# Patient Record
Sex: Female | Born: 1940 | Race: White | Hispanic: No | Marital: Married
Health system: Midwestern US, Community
[De-identification: ages and names within clinical notes are randomized; demographics above are authoritative.]

## PROBLEM LIST (undated history)

## (undated) DIAGNOSIS — E034 Atrophy of thyroid (acquired): Secondary | ICD-10-CM

## (undated) DIAGNOSIS — E7800 Pure hypercholesterolemia, unspecified: Secondary | ICD-10-CM

## (undated) DIAGNOSIS — Z1231 Encounter for screening mammogram for malignant neoplasm of breast: Secondary | ICD-10-CM

## (undated) DIAGNOSIS — Z8249 Family history of ischemic heart disease and other diseases of the circulatory system: Secondary | ICD-10-CM

## (undated) DIAGNOSIS — E039 Hypothyroidism, unspecified: Secondary | ICD-10-CM

## (undated) DIAGNOSIS — Z1239 Encounter for other screening for malignant neoplasm of breast: Secondary | ICD-10-CM

## (undated) DIAGNOSIS — Z136 Encounter for screening for cardiovascular disorders: Secondary | ICD-10-CM

## (undated) DIAGNOSIS — R928 Other abnormal and inconclusive findings on diagnostic imaging of breast: Secondary | ICD-10-CM

## (undated) DIAGNOSIS — K5730 Diverticulosis of large intestine without perforation or abscess without bleeding: Secondary | ICD-10-CM

## (undated) MED ORDER — ATORVASTATIN 10 MG TAB
10 mg | ORAL_TABLET | Freq: Every day | ORAL | Status: DC
Start: ? — End: 2014-12-24

## (undated) MED ORDER — LEVOTHYROXINE 100 MCG TAB
100 mcg | ORAL_TABLET | Freq: Every day | ORAL | Status: DC
Start: ? — End: 2013-10-12

## (undated) MED ORDER — LEVOTHYROXINE 100 MCG TAB
100 mcg | ORAL_TABLET | Freq: Every day | ORAL | Status: DC
Start: ? — End: 2014-09-27

## (undated) MED ORDER — ATORVASTATIN 10 MG TAB
10 mg | ORAL_TABLET | Freq: Every day | ORAL | Status: DC
Start: ? — End: 2013-10-12

---

## 2012-08-24 ENCOUNTER — Encounter

## 2013-05-04 NOTE — Progress Notes (Signed)
HISTORY OF PRESENT ILLNESS  Jaime Bailey is a 72 y.o. female.  HPI  She is a new patient and is here to establish care.   Recently moved to town.  The following sections were reviewed & updated as appropriate: PMH, PL, PSH, and SH.  Husband is retired Web designer.    Cardiovascular Review  The patient has hypertension.  She reports taking medications as instructed, no medication side effects noted.  Diet and Lifestyle: generally follows a low fat low cholesterol diet, exercises regularly, walks daily.  Lab review: no lab studies available for review at time of visit.  She reports being on brand name medication until 6 months ago.    Endocrine Review  Patient is seen for followup of hypothyroidism.  She reports medication compliance: compliant all of the time.  She reports the following concerns/problems: none.  Lab review: no lab studies available for review at time of visit.           Prior to Admission medications    Medication Sig Start Date End Date Taking? Authorizing Provider   atorvastatin (LIPITOR) 10 mg tablet Take  by mouth daily.   Yes Historical Provider   levothyroxine (SYNTHROID) 100 mcg tablet Take  by mouth Daily (before breakfast).   Yes Historical Provider   aspirin delayed-release 81 mg tablet Take  by mouth daily.   Yes Historical Provider        No Known Allergies       Patient Active Problem List    Diagnosis Date Noted   ??? Hypothyroid    ??? Hyperlipidemia        Past Surgical History   Procedure Laterality Date   ??? Hx tah and bso  1999   ??? Hx colonoscopy  1998     Colonic polyp removal at time of colonoscopy       History   Substance Use Topics   ??? Smoking status: Former Smoker -- 1.00 packs/day for 10 years     Quit date: 05/04/1977   ??? Smokeless tobacco: Not on file   ??? Alcohol Use: 0.5 oz/week     1 Glasses of wine per week           Review of Systems   Constitutional: Negative for weight loss and malaise/fatigue.   Respiratory: Negative for cough and shortness of breath.    Cardiovascular:  Negative for chest pain and palpitations.   Gastrointestinal: Positive for diarrhea and constipation. Negative for nausea, vomiting and abdominal pain.       Physical Exam   Constitutional: She appears well-developed. No distress.   HENT:   Mouth/Throat: Mucous membranes are normal.   Eyes: Conjunctivae and lids are normal. No scleral icterus.   Neck: Neck supple. No thyromegaly present.   Cardiovascular: Regular rhythm and normal heart sounds.    No murmur heard.  Pulmonary/Chest: Effort normal and breath sounds normal. She has no wheezes. She has no rales.   Abdominal: Soft. Bowel sounds are normal. She exhibits no mass. There is no hepatosplenomegaly. There is no tenderness.   Musculoskeletal: She exhibits no edema.   Lymphadenopathy:     She has no cervical adenopathy.   Skin: No rash noted.   Psychiatric: She has a normal mood and affect. Her behavior is normal.       BP 122/82   Pulse 60   Temp(Src) 98 ??F (36.7 ??C) (Oral)   Resp 14   Ht 5\' 6"  (1.676 m)   Wt 190  lb 1.6 oz (86.229 kg)   BMI 30.7 kg/m2   SpO2 98%      ASSESSMENT and PLAN  Jaime Bailey was seen today for establish care and labs.  Diagnoses and associated orders for this visit:    Hyperlipidemia- new dx to me, chronic issue, at goal, cont current meds pending review of labs  - METABOLIC PANEL, COMPREHENSIVE  - LIPID PANEL  -     atorvastatin (LIPITOR) 10 mg tablet; Take  by mouth daily.    Hypothyroid- new dx to me, chronic issue, previously well controlled, cont current meds pending review of labs  - TSH, 3RD GENERATION  -     levothyroxine (SYNTHROID) 100 mcg tablet; Take  by mouth Daily (before breakfast).    Obesity- Discussed the patient's above normal BMI with her.  The BMI follow up plan is as follows: BMI is out of normal parameters and plan is as follows: I have counseled this patient on diet and exercise regimens     Colon cancer screening  - REFERRAL TO GASTROENTEROLOGY        She has mammogram set up already        Follow-up Disposition:   Return in about 3 months (around 08/04/2013) for FULL PHYSICAL - 30 minutes.   Advised her to call back or return to office if symptoms worsen/change/persist.  Discussed expected course/resolution/complications of diagnosis in detail with patient.    Medication risks/benefits/costs/interactions/alternatives discussed with patient.  She was given an after visit summary which includes diagnoses, current medications, & vitals.  She expressed understanding with the diagnosis and plan.

## 2013-05-05 LAB — METABOLIC PANEL, COMPREHENSIVE
A-G Ratio: 1.9 (ref 1.1–2.5)
ALT (SGPT): 17 IU/L (ref 0–32)
AST (SGOT): 17 IU/L (ref 0–40)
Albumin: 4.5 g/dL (ref 3.5–4.8)
Alk. phosphatase: 81 IU/L (ref 39–117)
BUN/Creatinine ratio: 15 (ref 11–26)
BUN: 10 mg/dL (ref 8–27)
Bilirubin, total: 0.4 mg/dL (ref 0.0–1.2)
CO2: 30 mmol/L — ABNORMAL HIGH (ref 19–28)
Calcium: 10.2 mg/dL (ref 8.6–10.2)
Chloride: 105 mmol/L (ref 97–108)
Creatinine: 0.65 mg/dL (ref 0.57–1.00)
GFR est AA: 103 mL/min/{1.73_m2} (ref >59)
GFR est non-AA: 90 mL/min/{1.73_m2} (ref >59)
GLOBULIN, TOTAL: 2.4 g/dL (ref 1.5–4.5)
Glucose: 84 mg/dL (ref 65–99)
Potassium: 4.4 mmol/L (ref 3.5–5.2)
Protein, total: 6.9 g/dL (ref 6.0–8.5)
Sodium: 143 mmol/L (ref 134–144)

## 2013-05-05 LAB — LIPID PANEL
Cholesterol, total: 191 mg/dL (ref 100–199)
HDL Cholesterol: 50 mg/dL (ref >39)
LDL, calculated: 97 mg/dL (ref 0–99)
Triglyceride: 219 mg/dL — ABNORMAL HIGH (ref 0–149)
VLDL, calculated: 44 mg/dL — ABNORMAL HIGH (ref 5–40)

## 2013-05-05 LAB — TSH 3RD GENERATION: TSH: 1.76 u[IU]/mL (ref 0.450–4.500)

## 2013-05-05 NOTE — Progress Notes (Signed)
Quick Note:    Letter sent to patient. All labs are stable or at goal.  ______

## 2013-07-06 ENCOUNTER — Inpatient Hospital Stay: Payer: MEDICARE

## 2013-07-06 MED ADMIN — fentaNYL citrate (PF) injection 25-100 mcg: INTRAVENOUS | @ 12:00:00 | NDC 00641602701

## 2013-07-06 MED ADMIN — midazolam (VERSED) injection 0.25-5 mg: INTRAVENOUS | @ 13:00:00 | NDC 00641605901

## 2013-07-06 MED ADMIN — midazolam (VERSED) injection 0.25-5 mg: INTRAVENOUS | @ 12:00:00 | NDC 00641605901

## 2013-07-06 MED ADMIN — 0.9% sodium chloride infusion: INTRAVENOUS | @ 12:00:00 | NDC 00409798303

## 2013-07-06 NOTE — Procedures (Signed)
Procedures  by Joseph Berkshire, MD at 07/06/13 [phone]                Author: Joseph Berkshire, MD  Service: Gastroenterology  Author Type: Physician       Filed: 07/06/13 0851  Date of Service: 07/06/13 0847  Status: Signed          Editor: Joseph Berkshire, MD (Physician)            Pre-procedure Diagnoses        1. Abdominal pain, acute, left lower quadrant [789.04, 338.19]        2. Constipation [564.00]                           Post-procedure Diagnoses        1. Internal hemorrhoids [455.0]        2. Diverticulosis of colon (without mention of hemorrhage) [562.10]                           Procedures        1. COLONOSCOPY,DIAGNOSTIC [46962 (CPT??)]                              [facility]   [address]   [place], [place] [address]   [phone]                                      Colonoscopy Procedure Note         Indications:    Abdominal pain, LLQ - 789.04, Constipation - 564.0       Operator:  Nira Retort, MD      Referring Provider: Bud Face, MD      Sedation:  Versed 6 mg IV and Fentanyl 100 mcg IV      Procedure Details:  After informed consent was obtained with all risks and benefits of procedure explained and preoperative exam completed,  the patient was taken to the endoscopy suite and placed in the left lateral decubitus position.  Upon sequential sedation as per above, a digital rectal exam was performed  And was normal.  The Olympus videocolonoscope  was inserted in the rectum and  carefully advanced to the cecum, which was identified by the ileocecal valve and appendiceal orifice.  The quality of preparation was good.  The colonoscope was slowly withdrawn with careful evaluation between folds. Retroflexion in the rectum was performed  and was normal..       Findings:    Rectum: Small internal hemorrhoids were seen and were not bleeding.    Sigmoid:     - Extensive diverticulosis was seen.   There was slight luminal narrowing and mucosal edema in  the area of diverticulosis. There was fixity of colon in sigmoid colon as well.    Descending Colon:     - Diverticulosis   Transverse Colon: normal   Ascending Colon: normal   Cecum: normal   Terminal Ileum: not intubated      Interventions:  none      Specimen Removed:  * No specimens in log *      Complications: None.       EBL:  None.      Recommendations: -Repeat colonoscopy  in 5 years. - Resume normal medication(s). -High  fiber diet       Discharge Disposition:  Home in the company of a driver when able to ambulate.      Nira Retort, MD   07/06/2013  8:48 AM

## 2013-07-06 NOTE — H&P (Signed)
[facility]  [address] [place]  [place], [place]  [phone]                                History and Physical     NAME: PALLAS WAHLERT   DOB:  1941-07-11   MRN:  [id number]     HPI:       Past Surgical History   Procedure Laterality Date   ??? Hx tah and bso  1999   ??? Hx colonoscopy  1998     Colonic polyp removal at time of colonoscopy     Past Medical History   Diagnosis Date   ??? Hypothyroid    ??? Hyperlipidemia    ??? Kidney stone    ??? Diverticulosis    ??? Concussion 2002   ??? Arthritis      finger     History   Substance Use Topics   ??? Smoking status: Former Smoker -- 1.00 packs/day for 10 years     Quit date: 05/04/1977   ??? Smokeless tobacco: Not on file      Comment: former cigarette smoker   ??? Alcohol Use: 0.5 oz/week     1 Glasses of wine per week     Allergies   Allergen Reactions   ??? Latex, Natural Rubber Itching     Family History   Problem Relation Age of Onset   ??? Cancer Mother      GBM   ??? COPD Father    ??? Heart Disease Father      ASCVD   ??? Other Brother      AAA   ??? Diabetes Brother    ??? Cancer Brother      bladder     Current Facility-Administered Medications   Medication Dose Route Frequency   ??? 0.9% sodium chloride infusion  50 mL/hr IntraVENous CONTINUOUS   ??? midazolam (VERSED) injection 0.25-5 mg  0.25-5 mg IntraVENous Multiple   ??? fentaNYL citrate (PF) injection 25-100 mcg  25-100 mcg IntraVENous Multiple   ??? nalOXone (NARCAN) injection 0.4 mg  0.4 mg IntraVENous Multiple   ??? flumazenil (ROMAZICON) 0.1 mg/mL injection 0.2 mg  0.2 mg IntraVENous Multiple   ??? simethicone (MYLICON) 40mg /0.43mL oral drops 80 mg  1.2 mL Oral Multiple         PHYSICAL EXAM:  General: WD, WN. Alert, cooperative, no acute distress????  HEENT: NC, Atraumatic.  PERRLA, EOMI. Anicteric sclerae.  Lungs:  CTA Bilaterally. No Wheezing/Rhonchi/Rales.  Heart:  Regular  rhythm,?? No murmur, No Rubs, No Gallops  Abdomen: Soft, Non distended, Non tender. ??+Bowel sounds, no HSM  Extremities: No c/c/e   Neurologic:?? CN 2-12 gi, Alert and oriented X 3.  No acute neurological distress   Psych:???? Good insight.??Not anxious nor agitated.           Assessment:   Lower abdominal pain  Change in bowel habits    Plan:   ?? Endoscopic procedure  ?? Conscious sedation   ??   ??

## 2013-07-06 NOTE — Procedures (Signed)
[facility]  [address]  [place], [place] [address]  [phone]                              Colonoscopy Procedure Note      Indications:    Abdominal pain, LLQ - 789.04, Constipation - 564.0     Operator:  Nira Retort, MD    Referring Provider: Bud Face, MD    Sedation:  Versed 6 mg IV and Fentanyl 100 mcg IV    Procedure Details:  After informed consent was obtained with all risks and benefits of procedure explained and preoperative exam completed, the patient was taken to the endoscopy suite and placed in the left lateral decubitus position.  Upon sequential sedation as per above, a digital rectal exam was performed  And was normal.  The Olympus videocolonoscope  was inserted in the rectum and carefully advanced to the cecum, which was identified by the ileocecal valve and appendiceal orifice.  The quality of preparation was good.  The colonoscope was slowly withdrawn with careful evaluation between folds. Retroflexion in the rectum was performed and was normal..     Findings:   Rectum: Small internal hemorrhoids were seen and were not bleeding.   Sigmoid:     - Extensive diverticulosis was seen.  There was slight luminal narrowing and mucosal edema in the area of diverticulosis. There was fixity of colon in sigmoid colon as well.   Descending Colon:     - Diverticulosis  Transverse Colon: normal  Ascending Colon: normal  Cecum: normal  Terminal Ileum: not intubated    Interventions:  none    Specimen Removed:  * No specimens in log *    Complications: None.     EBL:  None.    Recommendations: -Repeat colonoscopy in 5 years. - Resume normal medication(s). -High fiber diet     Discharge Disposition:  Home in the company of a driver when able to ambulate.    Nira Retort, MD  07/06/2013  8:48 AM

## 2013-07-06 NOTE — Other (Signed)
Jaime Bailey  03/20/1941  [id number]    Situation:  Verbal report received from: Meg  Procedure: Procedure(s):  COLONOSCOPY    Background:    Preoperative diagnosis: IBS,ABDOMINAL PAIN,DIVERCULAR DISEASE OF COLON    Postoperative diagnosis: Sigmoid and descending colon diverticulosis, nternal hemorriods    Operator:  Dr. Nira Retort, MD    Assistant(s): Endoscopy Technician-1: Glennie Hawk; Barkley Boards  Endoscopy RN-1: Sharman Crate, RN    Specimens: * No specimens in log *  H. Pylori  no    Assessment:  Intra-procedure medications   Versed 6 mg    Fentanyl 100 mcg    Intravenous fluids: NS@ KVO     Vital signs stable yes    Abdominal assessment: round and soft  soft    Recommendation:  Discharge patient per MD order yess.  Return to floor na  Family or Friend  fam  Permission to share finding with family or friend yes

## 2013-10-01 ENCOUNTER — Encounter

## 2013-10-09 ENCOUNTER — Encounter

## 2013-10-09 NOTE — Progress Notes (Signed)
HISTORY OF PRESENT ILLNESS  Jaime Bailey is a 72 y.o. female.  HPI  Annual Wellness Visit    End of Life Planning: has an advanced directive - a copy HAS NOT been provided., reviewed DNR/DNI and patient is not interested  Info for [employer]: Information card provided to the patient      Depression Screen:  PHQ 2 / 9, over the last two weeks 10/09/2013   Little interest or pleasure in doing things Not at all   Feeling down, depressed or hopeless Not at all   Total Score PHQ 2 0         Fall Risk:   Fall Risk Assessment, last 12 mths 10/09/2013   Able to walk? Yes   Fall in past 12 months? Yes   Fall with injury? No   Number of falls in past 12 months 1   Fall Risk Score 1       Alcohol Risk Factor Screening:  On any occasion during the past 3 months, have you had more than 3 drinks containing alcohol?  No  Do you average more than 7 drinks per week?  No    Hearing Loss:  She reports noticing some changes w/ watching tv    Activities of Daily Living:  Self-care.   Requires assistance with: no ADLs    Abuse Screen:  None    Adult Nutrition Screen:  No risk factors noted. has lost 10 lbs, on purpose      Health Maintenance  Daily Aspirin: yes  Bone Density: up to date  Glaucoma Screening: UTD    Immunizations:     Influenza: up to date.  Tetanus: overdue, reviewed indications.  Shingles: up to date.  Pneumovax: UTD, reviewed guideline changes, will plan on Prevnar next year.     Cancer screening:    Cervical: n/a.  Breast: done today, reviewed SBE.  Colon: up to date.      Patient Care Team:  Bud Face, MD as PCP - General (Internal Medicine)  Nira Retort, MD (Gastroenterology)       The following sections were reviewed & updated as appropriate: PMH, PSH, FH, and SH.      Patient Active Problem List   Diagnosis Code   ??? Hypothyroid 244.9   ??? Hyperlipidemia 272.4   ??? Overweight 278.02          Prior to Admission medications    Medication Sig Start Date End Date Taking? Authorizing Provider    atorvastatin (LIPITOR) 10 mg tablet Take 1 Tab by mouth daily. 06/08/13  Yes Bud Face, MD   levothyroxine (SYNTHROID) 100 mcg tablet Take 1 Tab by mouth Daily (before breakfast). 06/08/13  Yes Bud Face, MD   aspirin delayed-release 81 mg tablet Take 81 mg by mouth daily.   Yes Historical Provider          Allergies   Allergen Reactions   ??? Latex, Natural Rubber Itching          Review of Systems   Constitutional: Negative for weight loss and malaise/fatigue.   Eyes: Negative for blurred vision.   Respiratory: Negative for cough and shortness of breath.    Cardiovascular: Negative for chest pain, palpitations and leg swelling.   Gastrointestinal: Negative for heartburn, nausea, vomiting, abdominal pain, diarrhea and constipation.   Genitourinary: Negative for frequency.   Musculoskeletal: Negative for myalgias and joint pain.   Skin: Negative for rash.        Left  1st toe nail, discoloration, asking about treatment   Neurological: Negative for tingling, sensory change, focal weakness and headaches.   Endo/Heme/Allergies: Does not bruise/bleed easily.   Psychiatric/Behavioral: Negative for depression. The patient is not nervous/anxious and does not have insomnia.        Physical Exam   Constitutional: She appears well-developed. No distress.   HENT:   Right Ear: Tympanic membrane, external ear and ear canal normal.   Left Ear: Tympanic membrane, external ear and ear canal normal.   Nose: Nose normal.   Mouth/Throat: Uvula is midline, oropharynx is clear and moist and mucous membranes are normal. No posterior oropharyngeal erythema.   Eyes: Conjunctivae and lids are normal. No scleral icterus.   Neck: Neck supple. Carotid bruit is not present. No thyromegaly present.   Cardiovascular: Regular rhythm and normal heart sounds.    No murmur heard.  Pulses:       Dorsalis pedis pulses are 2+ on the right side, and 2+ on the left side.        Posterior tibial pulses are 2+ on the right side, and 2+ on  the left side.   Pulmonary/Chest: Effort normal and breath sounds normal. She has no wheezes. She has no rales.   Abdominal: Soft. Bowel sounds are normal. She exhibits no mass. There is no hepatosplenomegaly. There is no tenderness.   Musculoskeletal: Normal range of motion. She exhibits no edema.        Cervical back: Normal.        Thoracic back: She exhibits no bony tenderness.        Lumbar back: Normal.   Lymphadenopathy:     She has no cervical adenopathy.   Neurological: She has normal strength. No cranial nerve deficit or sensory deficit.   Skin: No rash noted.   Psychiatric: She has a normal mood and affect. Her behavior is normal.       BP 128/84   Pulse 72   Temp(Src) 98.4 ??F (36.9 ??C) (Oral)   Resp 12   Ht 5' 5.5" (1.664 m)   Wt 180 lb 9.6 oz (81.92 kg)   BMI 29.59 kg/m2   SpO2 98%      ASSESSMENT and PLAN  Jaime Bailey was seen today for complete physical.  Diagnoses and associated orders for this visit:    Routine general medical examination at a health care facility  - FULL CODE    Screening for depression    Screening for alcoholism    Overweight- improving, congratulated, Discussed the patient's above normal BMI with her.  The BMI follow up plan is as follows: BMI is out of normal parameters and plan is as follows: I have counseled this patient on diet and exercise regimens     Hypothyroid- well controlled, continue current treatment pending review of labs.   - TSH, 3RD GENERATION    Hyperlipidemia- well controlled, continue current treatment pending review of labs.   - METABOLIC PANEL, COMPREHENSIVE  - LIPID PANEL    Onychomycosis- new dx, minimal, reviewed tx options, she will defer at this time, will notify me if she wants to try meds        Follow-up Disposition:  Return in about 1 year (around 10/09/2014) for FULL PHYSICAL - 30 minutes.   Advised her to call back or return to office if symptoms worsen/change/persist.  Discussed expected course/resolution/complications of diagnosis in detail with  patient.    Medication risks/benefits/costs/interactions/alternatives discussed with patient.  She was given an after visit  summary which includes diagnoses, current medications, & vitals.  She expressed understanding with the diagnosis and plan.

## 2013-10-09 NOTE — Progress Notes (Signed)
Here for CPE.  No current problems except had recent stumble to knee.  Also some right middle finger pain.

## 2013-10-09 NOTE — Patient Instructions (Signed)
The best way to stay healthy is to live a healthy lifestyle. A healthy lifestyle includes regular exercise, eating a well-balanced diet, keeping a healthy weight and not smoking.    Regular physical exams and screening tests are another important way to take care of yourself. Preventive exams provided by health care providers can find health problems early when treatment works best and can keep you from getting certain diseases or illnesses. Preventive services include exams, lab tests, screenings, shots, monitoring and information to help you take care of your own health.    All people over 65 should have a pneumonia shot. Pneumonia shots are usually only needed once in a lifetime unless your doctor decides differently.       In addition to your physical exam, some screening tests are recommended:    All people over 65 should have a yearly flu shot.    People over 65 are at medium to high risk for Hepatitis B. Three shots are needed for complete protection.     Bone mass measurement (dexa scan) is recommended every two years.     Diabetes Mellitus screening is recommended every year.    Glaucoma is an eye disease caused by high pressure in the eye.  An eye exam is recommended every year.     Cardiovascular screening tests that check your cholesterol and other blood fat (lipid) levels are recommended every five years.     Colorectal Cancer screening tests help to find pre-cancerous polyps (growths in the colon) so they can be removed before they turn into cancer.  Tests ordered for screening depend on your personal and family history risk factors.    Screening for breast cancer is recommended yearly with a Mammogram.    Screening for cervical and vaginal cancer is recommended with a pelvic and Pap test every two years. However if you have had an abnormal pap in the past  three years or at high risk for cervical or vaginal cancer Medicare will cover a pap test and a pelvic exam every year.     Here is a list of your  current Health Maintenance items with a due date:  Health Maintenance Due   Topic Date Due   ??? Glaucoma Screening   03/04/2007   ??? Annual Well Visit  03/04/2007   ??? Flu Vaccine  07/13/2013         Hearing Evaluations:    [facility]  [email or web address]  [phone]    [employer] [place] (off [address], [address])  [place] ([address])  [place] ([address])  [place] ([address] [place])      [facility] [place] [facility] [place]   [email or web address]    [phone] --> [address], [place]  [phone] --> [address]  ([place])  [phone] --> [id number] [place]  ([place])

## 2013-10-12 ENCOUNTER — Encounter

## 2013-10-12 LAB — METABOLIC PANEL, COMPREHENSIVE
A-G Ratio: 1.8 (ref 1.1–2.5)
ALT (SGPT): 12 IU/L (ref 0–32)
AST (SGOT): 14 IU/L (ref 0–40)
Albumin: 4.2 g/dL (ref 3.5–4.8)
Alk. phosphatase: 89 IU/L (ref 39–117)
BUN/Creatinine ratio: 21 (ref 11–26)
BUN: 13 mg/dL (ref 8–27)
Bilirubin, total: 0.3 mg/dL (ref 0.0–1.2)
CO2: 22 mmol/L (ref 18–29)
Calcium: 10 mg/dL (ref 8.6–10.2)
Chloride: 105 mmol/L (ref 97–108)
Creatinine: 0.62 mg/dL (ref 0.57–1.00)
GFR est AA: 105 mL/min/{1.73_m2} (ref >59)
GFR est non-AA: 91 mL/min/{1.73_m2} (ref >59)
GLOBULIN, TOTAL: 2.3 g/dL (ref 1.5–4.5)
Glucose: 96 mg/dL (ref 65–99)
Potassium: 4.5 mmol/L (ref 3.5–5.2)
Protein, total: 6.5 g/dL (ref 6.0–8.5)
Sodium: 143 mmol/L (ref 134–144)

## 2013-10-12 LAB — LIPID PANEL
Cholesterol, total: 184 mg/dL (ref 100–199)
HDL Cholesterol: 48 mg/dL (ref >39)
LDL, calculated: 98 mg/dL (ref 0–99)
Triglyceride: 189 mg/dL — ABNORMAL HIGH (ref 0–149)
VLDL, calculated: 38 mg/dL (ref 5–40)

## 2013-10-12 LAB — TSH 3RD GENERATION: TSH: 1.62 u[IU]/mL (ref 0.450–4.500)

## 2013-10-12 NOTE — Progress Notes (Signed)
Quick Note:    Letter sent to patient. All labs are stable or at goal. Meds refilled x 1 yr  ______

## 2014-09-16 ENCOUNTER — Ambulatory Visit: Admit: 2014-09-16 | Discharge: 2014-09-16 | Payer: MEDICARE | Attending: Internal Medicine

## 2014-09-16 DIAGNOSIS — M25562 Pain in left knee: Secondary | ICD-10-CM

## 2014-09-16 NOTE — Patient Instructions (Addendum)
[facility] [place] [facility]      Ice: apply ice pack 10-15 minutes at a time 3-4 times per day    Medications:  Naproxen/Aleve: 220mg  (1-2 tabs every 12 hours, take with food)    Limit the amount of lifting to less then 25 lbs for the next week

## 2014-09-16 NOTE — Progress Notes (Signed)
Patient c/o left knee pain.

## 2014-09-16 NOTE — Progress Notes (Signed)
HPI:  Jaime Bailey is a 73 y.o. year old female who returns to clinic today for an acute visit:    Orthopedic Review  She presents do to left knee pain.  This is a new problem problem, has been present for 10 days, and symptoms have worsened.  She describes the pain as sharp.  It is constant.  The pain radiates no where.  She denies numbness, denies paresthesias, but does reports some swelling in the left shin.  Initial inciting event: started after lifting a heavy vase.  Trauma: None.  Exacerbating factors identifiable by patient are percocet & NSAIDs.  Previous workup: has been evalauted at [facility] last night.  US revealed baker's cyst and xray suspicious for pseudogout.  Given percocet which helped.        Prior to Admission medications    Medication Sig Start Date End Date Taking? Authorizing Provider   naproxen sodium (ALEVE) 220 mg tablet Take 220 mg by mouth three (3) times daily (with meals).   Yes Historical Provider   atorvastatin (LIPITOR) 10 mg tablet Take 1 tablet by mouth daily. 10/12/13  Yes Bud Facehristopher P Jaymond Waage, MD   levothyroxine (SYNTHROID) 100 mcg tablet Take 1 tablet by mouth Daily (before breakfast). 10/12/13  Yes Bud Facehristopher P Rivky Clendenning, MD   aspirin delayed-release 81 mg tablet Take 81 mg by mouth daily.   Yes Historical Provider          Allergies   Allergen Reactions   ??? Latex, Natural Rubber Itching           Review of Systems   Constitutional: Negative for fever and chills.   Musculoskeletal: Positive for myalgias.         Physical Exam   Constitutional: No distress.   Cardiovascular: Regular rhythm and normal heart sounds.    No murmur heard.  Pulmonary/Chest: Effort normal. She has no wheezes. She has no rales.   Musculoskeletal:        Left knee: She exhibits decreased range of motion, swelling and effusion. Tenderness found. No patellar tendon tenderness noted.        Left lower leg: She exhibits swelling.         BP 112/72 mmHg   Pulse 63   Temp(Src) 98.4 ??F (36.9 ??C) (Oral)   Resp 16    Ht 5' 5.5" (1.664 m)   Wt 182 lb 12.8 oz (82.918 kg)   BMI 29.95 kg/m2   SpO2 97%      Assessment & Plan:  Jaime Bailey was seen today for knee pain.  Diagnoses and associated orders for this visit:    Knee pain, left- new dx, reviewed favor baker's cyst vs pseudogout.  Will get records from ED for review.  Offered aspiration but declined.  Reviewed seeing ortho vs IR depending on imaging report.  Will cont NSAIDs w/ food & prn percocet.  Will cont w/ walker & rest.     Encounter for immunization  - ADMIN INFLUENZA VIRUS VAC  - INFLUENZA VIRUS VACCINE, HIGH DOSE SEASONAL, PRESERVATIVE FREE        Follow-up Disposition:  Return if symptoms worsen or fail to improve.   Advised her to call back or return to office if symptoms worsen/change/persist.  Discussed expected course/resolution/complications of diagnosis in detail with patient.    Medication risks/benefits/costs/interactions/alternatives discussed with patient.  She was given an after visit summary which includes diagnoses, current medications, & vitals.  She expressed understanding with the diagnosis and plan.

## 2014-09-24 NOTE — Addendum Note (Signed)
Addended by: Shan LevansSEAY, AMANDA J on: 09/24/2014 12:06 PM      Modules accepted: Level of Service

## 2014-09-27 MED ORDER — SYNTHROID 100 MCG TABLET
100 mcg | ORAL_TABLET | ORAL | Status: DC
Start: 2014-09-27 — End: 2015-07-12

## 2014-10-11 ENCOUNTER — Inpatient Hospital Stay: Admit: 2015-01-07 | Payer: MEDICARE

## 2014-10-11 ENCOUNTER — Ambulatory Visit: Admit: 2014-10-11 | Discharge: 2014-10-11 | Payer: MEDICARE | Attending: Internal Medicine

## 2014-10-11 DIAGNOSIS — Z0000 Encounter for general adult medical examination without abnormal findings: Secondary | ICD-10-CM

## 2014-10-11 DIAGNOSIS — E785 Hyperlipidemia, unspecified: Secondary | ICD-10-CM

## 2014-10-11 MED ORDER — DIPHTH,PERTUS(ACEL)TETANUS VAC(PF) 2 LF-(5-3-5MCG)-5 LF/0.5 ML IM SUSP
2 Lf-(.5-5-3-5 mcg)-5Lf/0.5 mL | Freq: Once | INTRAMUSCULAR | Status: AC
Start: 2014-10-11 — End: 2014-10-11

## 2014-10-11 MED ORDER — PNEUMOCOCCAL 13-VAL CONJ VACCINE-DIP CRM (PF) 0.5 ML IM SYRINGE
0.5 mL | Freq: Once | INTRAMUSCULAR | Status: AC
Start: 2014-10-11 — End: 2014-10-11

## 2014-10-11 NOTE — Progress Notes (Signed)
CPE.

## 2014-10-11 NOTE — Progress Notes (Signed)
HPI:  Jaime Bailey is a 73 y.o. year old female who returns to clinic today for an Annual Physical.    Annual Wellness Visit    End of Life Planning: This was discussed with her today and she has an advanced directive - a copy HAS NOT been provided.  Reviewed DNR/DNI and patient is not interested.      Depression Screen:  PHQ 2 / 9, over the last two weeks 10/11/2014   Little interest or pleasure in doing things Not at all   Feeling down, depressed or hopeless Not at all   Total Score PHQ 2 0         Fall Risk:   Fall Risk Assessment, last 12 mths 10/11/2014   Able to walk? Yes   Fall in past 12 months? No   Fall with injury? -   Number of falls in past 12 months -   Fall Risk Score -       Abuse Screen:  Abuse Screening Questionnaire 10/11/2014   Do you ever feel afraid of your partner? N   Are you in a relationship with someone who physically or mentally threatens you? N   Is it safe for you to go home? Y         Alcohol Risk Factor Screening:  On any occasion during the past 3 months, have you had more than 3 drinks containing alcohol?  No  Do you average more than 7 drinks per week?  No    Hearing Loss:  Mild, no significant changes    Activities of Daily Living:  Self-care.   Requires assistance with: no ADLs    Adult Nutrition Screen:  No risk factors noted.      Health Maintenance  Daily Aspirin: yes  Bone Density: she reports UTD, done in [place] 2 months ago  Glaucoma Screening: not UTD, she has a f/u with Dr Roxan Hockey at [facility]    Immunizations:     Influenza: up to date.  Tetanus: not UTD - script provided.  Shingles: up to date.  Pneumonia: due for Prevnar & script provided.     Cancer screening:    Cervical: n/a.  Breast: not up to date - reviewed guidelines, will stop after this check, reviewed SBE.  Colon: up to date.      Patient Care Team:  Bud Face, MD as PCP - General (Internal Medicine)  Nira Retort, MD (Gastroenterology)  Dorene Grebe IV, MD (Ophthalmology)        In addition to the above issues we also reviewed the following acute and/or chronic problems:    Endocrine Review  Patient is seen for followup of hypothyroidism.  Since last visit: no changes, but is taking meds w/ coffee w/ cream.  She reports medication compliance: compliant all of the time.  She reports the following concerns/problems/med side effects: none.  Lab review: labs reviewed and discussed with patient.      Cardiovascular Review  The patient has hyperlipidemia.  She reports taking medications as instructed, no medication side effects noted.  Diet and Lifestyle: generally follows a low fat low cholesterol diet, sedentary.  Lab review: labs reviewed and discussed with patient.          The following sections were reviewed & updated as appropriate: PMH, PSH, FH, and SH.      Patient Active Problem List   Diagnosis Code   ??? Hypothyroid E03.9   ??? Hyperlipidemia E78.5   ??? Overweight    ???  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 Authorizing Provider   SYNTHROID 100 mcg tablet TAKE 1 TABLET BY MOUTH EVERY DAY BEFORE BREAKFAST 09/27/14  Yes Bud Facehristopher P Micah Galeno, MD   naproxen sodium (ALEVE) 220 mg tablet Take 440 mg by mouth two (2) times daily (with meals).   Yes Historical Provider   atorvastatin (LIPITOR) 10 mg tablet Take 1 tablet by mouth daily. 10/12/13  Yes Bud Facehristopher P Eurydice Calixto, MD   aspirin delayed-release 81 mg tablet Take 81 mg by mouth daily.   Yes Historical Provider          Allergies   Allergen Reactions   ??? Latex, Natural Rubber Itching           Review of Systems   Constitutional: Negative for fever and chills.   Respiratory: Negative for cough and shortness of breath.    Cardiovascular: Negative for chest pain and palpitations.   Gastrointestinal: Negative for heartburn, nausea, vomiting, abdominal pain, diarrhea, constipation and blood in stool.   Genitourinary: Negative for urgency and frequency.    Musculoskeletal: Positive for joint pain (L knee is unchanged, not giving out or locking up but gets "heavier or more painful" as the day goes on). Negative for myalgias.   Neurological: Negative for tingling, focal weakness and headaches.   Endo/Heme/Allergies: Does not bruise/bleed easily.   Psychiatric/Behavioral: Negative for depression. The patient is not nervous/anxious and does not have insomnia.          Physical Exam   Constitutional: She appears well-developed. No distress.   HENT:   Right Ear: Tympanic membrane, external ear and ear canal normal.   Left Ear: Tympanic membrane, external ear and ear canal normal.   Nose: Nose normal.   Mouth/Throat: Uvula is midline, oropharynx is clear and moist and mucous membranes are normal. No posterior oropharyngeal erythema.   Eyes: Conjunctivae and lids are normal. No scleral icterus.   Neck: Neck supple. Carotid bruit is not present. No thyromegaly present.   Cardiovascular: Regular rhythm and normal heart sounds.  Bradycardia present.    No murmur heard.  Pulses:       Dorsalis pedis pulses are 2+ on the right side, and 2+ on the left side.        Posterior tibial pulses are 2+ on the right side, and 2+ on the left side.   Pulmonary/Chest: Effort normal and breath sounds normal. She has no wheezes. She has no rales.   Abdominal: Soft. Bowel sounds are normal. She exhibits no mass. There is no hepatosplenomegaly. There is no tenderness.   Musculoskeletal: She exhibits no edema.        Left knee: She exhibits decreased range of motion (pain w/ internal rotation) and swelling. No tenderness found.        Cervical back: Normal.        Thoracic back: She exhibits no bony tenderness.        Lumbar back: Normal.   Lymphadenopathy:     She has no cervical adenopathy.   Neurological: She has normal strength. No cranial nerve deficit or sensory deficit.   Skin: No rash noted.   Psychiatric: She has a normal mood and affect. Her behavior is normal.           BP 132/86 mmHg   Pulse 62   Temp(Src) 98.2 ??F (36.8 ??C) (Oral)   Resp 16  Ht 5' 5.75" (1.67 m)   Wt 183 lb 12.8 oz (83.371 kg)   BMI 29.89 kg/m2   SpO2 99%      Assessment & Plan:  [name] was seen today for well woman.  Diagnoses and associated orders for this visit:    Routine general medical examination at a health care facility    ACP (advance care planning)    Encounter for immunization  - pneumococcal 13 val conj dip (PREVNAR-13) 0.5 mL syrg injection; 0.5 mL by IntraMUSCular route once for 1 dose.  - diph,Pertuss,Acell,,Tet Vac-PF (ADACEL) 2 Lf-(2.5-5-3-5 mcg)-5Lf/0.5 mL susp; 0.5 mL by IntraMUSCular route once for 1 dose.    Screening for depression    Screening for alcoholism    Encounter for breast cancer screening other than mammogram  - MAM MAMMO BI SCREENING DIGTL; Future    Knee pain, left- unchanged to slightly worse, reviewed meniscus vs ligament more then baker's cyst, will check MRI, cont w/ ice & start using wrap  - MRI KNEE LT WO CONT; Future    Hypothyroidism due to acquired atrophy of thyroid- previously well controlled, continue current treatment pending review of labs   - TSH, 3RD GENERATION    Hyperlipidemia- previously well controlled, continue current treatment pending review of labs   - METABOLIC PANEL, COMPREHENSIVE  - CBC W/O DIFF  - LIPID PANEL         Follow-up Disposition:  Return in about 1 year (around 10/12/2015) for FULL PHYSICAL - 30 minutes.   Advised her to call back or return to office if symptoms worsen/change/persist.  Discussed expected course/resolution/complications of diagnosis in detail with patient.    Medication risks/benefits/costs/interactions/alternatives discussed with patient.  She was given an after visit summary which includes diagnoses, current medications, & vitals.  She expressed understanding with the diagnosis and plan.

## 2014-10-11 NOTE — Patient Instructions (Signed)
The best way to stay healthy is to live a healthy lifestyle. A healthy lifestyle includes regular exercise, eating a well-balanced diet, keeping a healthy weight and not smoking.    Regular physical exams and screening tests are another important way to take care of yourself. Preventive exams provided by health care providers can find health problems early when treatment works best and can keep you from getting certain diseases or illnesses. Preventive services include exams, lab tests, screenings, shots, monitoring and information to help you take care of your own health.    All people over 65 should have a pneumonia shot. Pneumonia shots are usually only needed once in a lifetime unless your doctor decides differently.       In addition to your physical exam, some screening tests are recommended:    All people over 65 should have a yearly flu shot.    People over 65 are at medium to high risk for Hepatitis B. Three shots are needed for complete protection.     Bone mass measurement (dexa scan) is recommended every two years.     Diabetes Mellitus screening is recommended every year.    Glaucoma is an eye disease caused by high pressure in the eye.  An eye exam is recommended every year.     Cardiovascular screening tests that check your cholesterol and other blood fat (lipid) levels are recommended every five years.     Colorectal Cancer screening tests help to find pre-cancerous polyps (growths in the colon) so they can be removed before they turn into cancer.  Tests ordered for screening depend on your personal and family history risk factors.    Prostate Cancer Screening (annually up to age 72)    Screening for breast cancer is recommended yearly with a Mammogram.    Screening for cervical and vaginal cancer is recommended with a pelvic and Pap test every two years. However if you have had an abnormal pap in the past  three years or at high risk for cervical or vaginal cancer Medicare  will cover a pap test and a pelvic exam every year.     Here is a list of your current Health Maintenance items with a due date:  Health Maintenance Due   Topic Date Due   ??? Td Vaccine  12/13/2010   ??? Annual Well Visit  10/10/2014           Advance Directives: After Your Visit  Your Care Instructions  An advance directive is a legal way to state your wishes at the end of your life. It tells your family and your doctor what to do if you can no longer say what you want.  There are two main types of advance directives. You can change them any time that your wishes change.  ?? A living will tells your family and your doctor your wishes about life support and other treatment.  ?? A medical power of attorney lets you name a person to make treatment decisions for you when you can't speak for yourself. This person is called a health care agent.  If you do not have an advance directive, decisions about your medical care may be made by a doctor or a judge who doesn't know you.  It may help to think of an advance directive as a gift to the people who care for you. If you have one, they won't have to make tough decisions by themselves.  Follow-up care is a key part of your  treatment and safety. Be sure to make and go to all appointments, and call your doctor if you are having problems. It's also a good idea to know your test results and keep a list of the medicines you take.  How can you care for yourself at home?  ?? Discuss your wishes with your loved ones and your doctor. This way, there are no surprises.  ?? Many states have a unique form. Or you might use a universal form that has been approved by many states. This kind of form can sometimes be completed and stored online. Your electronic copy will then be available wherever you have a connection to the Internet. In most cases, doctors will respect your wishes even if you have a form from a different state.   ?? You don't need a lawyer to do an advance directive. But you may want to get legal advice.  ?? Think about these questions when you prepare an advance directive:  ?? Who do you want to make decisions about your medical care if you are not able to? Many people choose a family member, close friend, or doctor.  ?? Do you know enough about life support methods that might be used? If not, talk to your doctor so you understand.  ?? What are you most afraid of that might happen? You might be afraid of having pain, losing your independence, or being kept alive by machines.  ?? Where would you prefer to die? Choices include your home, a hospital, or a nursing home.  ?? Would you like to have information about hospice care to support you and your family?  ?? Do you want to donate organs when you die?  ?? Do you want certain religious practices performed before you die? If so, put your wishes in the advance directive.  ?? Read your advance directive every year, and make changes as needed.  When should you call for help?  Be sure to contact your doctor if you have any questions.   Where can you learn more?   Go to [email or web address]  Enter R264 in the search box to learn more about "Advance Directives: After Your Visit."   ?? 2006-2015 [employer]. Care instructions adapted under license by [employer] (which disclaims liability or warranty for this information). This care instruction is for use with your licensed healthcare professional. If you have questions about a medical condition or this instruction, always ask your healthcare professional. [employer] any warranty or liability for your use of this information.  Content Version: 10.5.422740; Current as of: February 01, 2014

## 2014-10-12 LAB — LIPID PANEL
Cholesterol, total: 203 mg/dL — ABNORMAL HIGH (ref 100–199)
HDL Cholesterol: 48 mg/dL (ref >39)
LDL, calculated: 105 mg/dL — ABNORMAL HIGH (ref 0–99)
Triglyceride: 251 mg/dL — ABNORMAL HIGH (ref 0–149)
VLDL, calculated: 50 mg/dL — ABNORMAL HIGH (ref 5–40)

## 2014-10-12 LAB — METABOLIC PANEL, COMPREHENSIVE
A-G Ratio: 2 (ref 1.1–2.5)
ALT (SGPT): 16 IU/L (ref 0–32)
AST (SGOT): 19 IU/L (ref 0–40)
Albumin: 4.5 g/dL (ref 3.5–4.8)
Alk. phosphatase: 93 IU/L (ref 39–117)
BUN/Creatinine ratio: 26 (ref 11–26)
BUN: 16 mg/dL (ref 8–27)
Bilirubin, total: 0.3 mg/dL (ref 0.0–1.2)
CO2: 27 mmol/L (ref 18–29)
Calcium: 10.1 mg/dL (ref 8.7–10.3)
Chloride: 104 mmol/L (ref 97–108)
Creatinine: 0.62 mg/dL (ref 0.57–1.00)
GFR est AA: 104 mL/min/{1.73_m2} (ref >59)
GFR est non-AA: 90 mL/min/{1.73_m2} (ref >59)
GLOBULIN, TOTAL: 2.3 g/dL (ref 1.5–4.5)
Glucose: 88 mg/dL (ref 65–99)
Potassium: 4.5 mmol/L (ref 3.5–5.2)
Protein, total: 6.8 g/dL (ref 6.0–8.5)
Sodium: 142 mmol/L (ref 134–144)

## 2014-10-12 LAB — CBC W/O DIFF
HCT: 41.5 % (ref 34.0–46.6)
HGB: 13.8 g/dL (ref 11.1–15.9)
MCH: 28.9 pg (ref 26.6–33.0)
MCHC: 33.3 g/dL (ref 31.5–35.7)
MCV: 87 fL (ref 79–97)
PLATELET: 213 10*3/uL (ref 150–379)
RBC: 4.77 x10E6/uL (ref 3.77–5.28)
RDW: 14 % (ref 12.3–15.4)
WBC: 5.9 10*3/uL (ref 3.4–10.8)

## 2014-10-12 LAB — TSH 3RD GENERATION: TSH: 1.87 u[IU]/mL (ref 0.450–4.500)

## 2014-10-13 NOTE — Progress Notes (Signed)
Quick Note:        Results released to patient via MyChart. All labs are stable or at goal for her.    ______

## 2014-10-17 ENCOUNTER — Inpatient Hospital Stay: Admit: 2014-10-17 | Payer: MEDICARE | Attending: Internal Medicine

## 2014-10-17 ENCOUNTER — Encounter

## 2014-10-17 DIAGNOSIS — Z1231 Encounter for screening mammogram for malignant neoplasm of breast: Secondary | ICD-10-CM

## 2014-10-18 ENCOUNTER — Inpatient Hospital Stay: Admit: 2014-10-18 | Payer: MEDICARE | Attending: Internal Medicine

## 2014-10-18 DIAGNOSIS — M25562 Pain in left knee: Secondary | ICD-10-CM

## 2014-10-24 NOTE — Telephone Encounter (Signed)
See my comments attached to her MRI, she should be able to see them on MyChart.    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 The Baker's cyst is still present for unchanged. I think the pain is from the meniscus. I would like you to see an orthopedic surgeon. I would recommend Dr Sullivan LoneJason Hull at [facility] [phone]).

## 2014-10-24 NOTE — Telephone Encounter (Signed)
Pt would like her MRI  results

## 2014-10-24 NOTE — Telephone Encounter (Signed)
Pt wants mri results, just say refer to ortho, she wants an explanation please advise.

## 2014-10-25 NOTE — Telephone Encounter (Signed)
MRI, LOV, labs sent to [facility]. Fax# [phone] to Dr. Rennie PlowmanHull.

## 2014-10-25 NOTE — Telephone Encounter (Signed)
Pt informed of MRI results and recommendation per Dr. Madilyn FiremanHayes' note, pt verbalized understanding and will call Dr. Rennie PlowmanHull asap for an evaluation.

## 2014-10-25 NOTE — Telephone Encounter (Signed)
Patient has made an appt with Dr. Rennie PlowmanHull for 10/30/14 at 10:15am.  They told her they would need Dr. Madilyn FiremanHayes' report and the MRI -- she asked if she needed to pick up the disc.  Fax# [phone]

## 2014-12-24 MED ORDER — ATORVASTATIN 10 MG TAB
10 mg | ORAL_TABLET | ORAL | Status: DC
Start: 2014-12-24 — End: 2015-11-26

## 2015-07-14 MED ORDER — SYNTHROID 100 MCG TABLET
100 mcg | ORAL_TABLET | ORAL | Status: DC
Start: 2015-07-14 — End: 2015-10-12

## 2015-08-04 ENCOUNTER — Encounter: Attending: Internal Medicine

## 2015-08-05 ENCOUNTER — Ambulatory Visit: Admit: 2015-08-05 | Payer: MEDICARE | Attending: Internal Medicine

## 2015-08-05 DIAGNOSIS — M25561 Pain in right knee: Secondary | ICD-10-CM

## 2015-08-05 NOTE — Progress Notes (Signed)
Chief Complaint   Patient presents with   ??? Knee Pain     right knee pain with swelling     1. Have you been to the ER, urgent care clinic since your last visit?  Hospitalized since your last visit?No    2. Have you seen or consulted any other health care providers outside of the [facility] since your last visit?  Include any pap smears or colon screening. No

## 2015-08-05 NOTE — Progress Notes (Signed)
Jaime Bailey is a 74 y.o. year old female who returns to clinic today for an acute visit to discuss the problem(s) below:    Assessment & Plan:   Kissie was seen today for knee pain.  Diagnoses and all orders for this visit:    Acute pain of right knee- this is a new problem, differential dx reviewed with the patient, sounds like it is related to possibly a Baker's cyst or meniscus issue.  Do not think it is infectious, gout, or articular.  Will treat with ice, NSAIDs, rest.  Will work on compression & elevation.  See AVS, Red flags were reviewed with the patient to RTC or notify me, expected time course for resolution reviewed.  To ortho is no changes.        Follow-up Disposition:  Return if symptoms worsen or fail to improve.   Advised her to call back or return to office if symptoms worsen/change/persist.  Discussed expected course/resolution/complications of diagnosis in detail with patient.    Medication risks/benefits/costs/interactions/alternatives discussed with patient.  She was given an after visit summary which includes diagnoses, current medications, & vitals.  She expressed understanding with the diagnosis and plan.      --------------------------------------------------    Subjective:  Orthopedic Review  She presents do to right knee pain.  This is a new problem problem, has been present for 12 days, and symptoms have remained unchanged.  She could not really describes the pain, located on the lower & inner aspect of the knee.  It is intermittent, only w/ certain positions or walking.  The pain radiates no where.  She denies locking up, but has been giving out.  Swelling started about 1 week ago.  Initial inciting event: None.  Trauma: None.  She has tried the following: ice.  These have been temporarily effective.  Previous workup: nothing.  She was seen last year for L knee pain.  They just returned from [facility] and the pain started prior to leaving,  but swelling started after they left.  She had similar issues with the L knee last year.        Prior to Admission medications    Medication Sig Start Date End Date Taking? Authorizing Provider   SYNTHROID 100 mcg tablet TAKE 1 TABLET BY MOUTH EVERY DAY 07/13/15  Yes Bud Face, MD   atorvastatin (LIPITOR) 10 mg tablet TAKE 1 TABLET BY MOUTH DAILY 12/24/14  Yes Bud Face, MD   naproxen sodium (ALEVE) 220 mg tablet Take 440 mg by mouth two (2) times daily (with meals).   Yes Historical Provider   aspirin delayed-release 81 mg tablet Take 81 mg by mouth daily.   Yes Historical Provider          Allergies   Allergen Reactions   ??? Latex, Natural Rubber Itching           Review of Systems   Constitutional: Negative for malaise/fatigue and weight loss.   Respiratory: Negative for cough and shortness of breath.    Cardiovascular: Negative for chest pain and palpitations.   Gastrointestinal: Negative for abdominal pain, constipation, diarrhea, nausea and vomiting.   Musculoskeletal: Positive for joint pain.         Objective:    Physical Exam   Constitutional: No distress.   Cardiovascular: Regular rhythm and normal heart sounds.    No murmur heard.  Pulmonary/Chest: Effort normal and breath sounds normal. She has no wheezes. She has no rales.   Musculoskeletal: She exhibits  no edema.        Right knee: She exhibits decreased range of motion (limited flexion due to swelling), swelling (significant) and deformity (posterior knee there is a 1cm firm mass, not tender, fixed). No tenderness found. No patellar tendon tenderness noted.         Visit Vitals   ??? BP 106/80 (BP 1 Location: Right arm, BP Patient Position: Sitting)   ??? Pulse 66   ??? Temp 97.5 ??F (36.4 ??C) (Oral)   ??? Resp 18   ??? Ht 5' 5.75" (1.67 m)   ??? Wt 192 lb 6.4 oz (87.3 kg)   ??? SpO2 96%   ??? BMI 31.29 kg/m2         Disclaimer:  Advised her to call back or return to office if symptoms worsen/change/persist.   Discussed expected course/resolution/complications of diagnosis in detail with patient.    Medication risks/benefits/costs/interactions/alternatives discussed with patient.  She was given an after visit summary which includes diagnoses, current medications, & vitals.  She expressed understanding with the diagnosis and plan.        Bud Face, MD

## 2015-08-05 NOTE — Patient Instructions (Signed)
Ice: apply ice pack 10-15 minutes at a time 3-4 times per day  Wrap it (ace bandage or sleeve)    Medications:  Ibuprofen/Advil: 200mg  (1-3 tabs every 4-6 hours, take with food)        OR  Naproxen/Aleve: 220mg  (1-2 tabs every 12 hours, take with food)    *Can not take these medications together.  *You can take Tylenol 500mg  (1 tabs every 6 hours, max dose of acetaminophen in 24 hrs is 3,000mg ) with either Advil or Aleve      Learning About RICE (Rest, Ice, Compression, and Elevation)  What is RICE?  RICE is a way to care for an injury. RICE helps relieve pain and swelling. It may also help with healing and flexibility. RICE stands for:  ?? Rest and protect the injured or sore area.  ?? Ice or a cold pack used as soon as possible.  ?? Compression, or wrapping the injured or sore area with an elastic bandage.  ?? Elevation (propping up) the injured or sore area.  How do you do RICE?  You can use RICE for home treatment when you have general aches and pains or after an injury or surgery.  Rest  ?? Do not put weight on the injury for at least 24 to 48 hours.  ?? Use crutches for a badly sprained knee or ankle.  ?? Support a sprained wrist, elbow, or shoulder with a sling.  Ice  ?? Put ice or a cold pack on the injury right away to reduce pain and swelling. Frozen vegetables will also work as an ice pack. Put a thin cloth between the ice or cold pack and your skin. The cloth protects the injured area from getting too cold.  ?? Use ice for 10 to 15 minutes at a time for the first 48 to 72 hours.  Compression  ?? Use compression for sprains, strains, and surgeries of the arms and legs.  ?? Wrap the injured area with an elastic bandage or compression sleeve to reduce swelling.  ?? Don't wrap it too tightly. If the area below it feels numb, tingles, or feels cool, loosen the wrap.  Elevation  ?? Use elevation for areas of the body that can be propped up, such as arms and legs.   ?? Prop up the injured area on pillows whenever you use ice. Keep it propped up anytime you sit or lie down.  ?? Try to keep the injured area at or above the level of your heart. This will help reduce swelling and bruising.  Where can you learn more?  Go to [employer]  Enter [id number] in the search box to learn more about "Learning About RICE (Rest, Ice, Compression, and Elevation)."  ?? 2006-2016 [employer]. Care instructions adapted under license by [employer] (which disclaims liability or warranty for this information). This care instruction is for use with your licensed healthcare professional. If you have questions about a medical condition or this instruction, always ask your healthcare professional. Healthwise, Incorporated disclaims any warranty or liability for your use of this information.  Content Version: 10.9.538570; Current as of: May 13, 2014

## 2015-08-12 NOTE — Telephone Encounter (Signed)
Immunization record requested from [place] in [place].

## 2015-08-12 NOTE — Telephone Encounter (Signed)
-----   Message from Bud Face, MD sent at 08/05/2015  8:16 PM EDT -----  Regarding: call to get records  She reports immunizations done at pharmacy in [facility], listed in computer, please call to get records.

## 2015-09-23 ENCOUNTER — Encounter: Attending: Internal Medicine

## 2015-10-06 ENCOUNTER — Ambulatory Visit: Admit: 2015-10-06 | Payer: MEDICARE | Attending: Internal Medicine

## 2015-10-06 DIAGNOSIS — J00 Acute nasopharyngitis [common cold]: Secondary | ICD-10-CM

## 2015-10-06 LAB — AMB POC RAPID INFLUENZA TEST: QuickVue Influenza test: NEGATIVE

## 2015-10-06 NOTE — Progress Notes (Signed)
HISTORY OF PRESENT ILLNESS  Jaime Bailey is a 74 y.o. female.  HPI  URI Review  Jaime Bailey is a 74 y.o. female who is here to talk about: congestion and fever for 6 days ago, gradually improving since that time.  She also reports left ear pain which has been minor.  She denies a history of SOB/DOE.  Evaluation to date: none.  Treatment to date: OTC products (tylenol), which have been somewhat effective.  Relevant PMH: No pertinent additional PMH.  Patient reports no known sick contacts.        Review of Systems   Constitutional: Negative for chills and fever.   HENT: Positive for congestion and sore throat.    Eyes: Negative.    Respiratory: Positive for cough. Negative for sputum production and wheezing.    Cardiovascular: Negative.    Genitourinary: Negative.    Neurological: Positive for headaches.       Visit Vitals   ??? BP 120/82 (BP 1 Location: Right arm, BP Patient Position: Sitting)   ??? Pulse 84   ??? Temp 98.5 ??F (36.9 ??C) (Oral)   ??? Resp 15   ??? Ht 5' 5.75" (1.67 m)   ??? Wt 190 lb (86.2 kg)   ??? SpO2 98%   ??? BMI 30.9 kg/m2       Physical Exam   Constitutional: She appears well-developed and well-nourished.   HENT:   Nose: Mucosal edema present.   Mouth/Throat: Oropharynx is clear and moist.   Cardiovascular: Normal rate, regular rhythm and normal heart sounds.    Pulmonary/Chest: Effort normal and breath sounds normal.     Rapid flu was negative.     ASSESSMENT and PLAN  Jaime Bailey was seen today for cough.    Diagnoses and all orders for this visit:    Acute nasopharyngitis- The patient was encouraged to maintain adequate hydration as well as nutrition.  Tylenol or motrin is recommended for pain or fevers. The patient will return to clinic if any worsened symptoms or if no resolution of symptoms.       -     AMB POC RAPID INFLUENZA TEST       Follow-up Disposition:  Return if symptoms worsen or fail to improve.   Advised the patient to call back or return to office if symptoms worsen/change/persist.    Discussed expected course/resolution/complications of diagnosis in detail with patient.     Medication risks/benefits/costs/interactions/alternatives discussed with patient.   The patient was given an after visit summary which includes diagnoses, current medications, & vitals.   They expressed understanding with the diagnosis and plan.

## 2015-10-06 NOTE — Patient Instructions (Signed)
Upper Respiratory Infection (Cold): Care Instructions  Your Care Instructions     An upper respiratory infection, or URI, is an infection of the nose, sinuses, or throat. URIs are spread by coughs, sneezes, and direct contact. The common cold is the most frequent kind of URI. The flu and sinus infections are other kinds of URIs.  Almost all URIs are caused by viruses. Antibiotics won't cure them. But you can treat most infections with home care. This may include drinking lots of fluids and taking over-the-counter pain medicine. You will probably feel better in 4 to 10 days.  The doctor has checked you carefully, but problems can develop later. If you notice any problems or new symptoms, get medical treatment right away.  Follow-up care is a key part of your treatment and safety. Be sure to make and go to all appointments, and call your doctor if you are having problems. It's also a good idea to know your test results and keep a list of the medicines you take.  How can you care for yourself at home?  ?? To prevent dehydration, drink plenty of fluids, enough so that your urine is light yellow or clear like water. Choose water and other caffeine-free clear liquids until you feel better. If you have kidney, heart, or liver disease and have to limit fluids, talk with your doctor before you increase the amount of fluids you drink.  ?? Take an over-the-counter pain medicine, such as acetaminophen (Tylenol), ibuprofen (Advil, Motrin), or naproxen (Aleve). Read and follow all instructions on the label.  ?? Before you use cough and cold medicines, check the label. These medicines may not be safe for young children or for people with certain health problems.  ?? Be careful when taking over-the-counter cold or flu medicines and Tylenol at the same time. Many of these medicines have acetaminophen, which is Tylenol. Read the labels to make sure that you are not taking  more than the recommended dose. Too much acetaminophen (Tylenol) can be harmful.  ?? Get plenty of rest.  ?? Do not smoke or allow others to smoke around you. If you need help quitting, talk to your doctor about stop-smoking programs and medicines. These can increase your chances of quitting for good.  When should you call for help?  Call 911 anytime you think you may need emergency care. For example, call if:  ?? You have severe trouble breathing.  Call your doctor now or seek immediate medical care if:  ?? You seem to be getting much sicker.  ?? You have new or worse trouble breathing.  ?? You have a new or higher fever.  ?? You have a new rash.  Watch closely for changes in your health, and be sure to contact your doctor if:  ?? You have a new symptom, such as a sore throat, an earache, or sinus pain.  ?? You cough more deeply or more often, especially if you notice more mucus or a change in the color of your mucus.  ?? You do not get better as expected.  Where can you learn more?  Go to http://www.healthwise.net/[employer]  Enter K520 in the search box to learn more about "Upper Respiratory Infection (Cold): Care Instructions."  ?? 2006-2016 [employer], [employer]. Care instructions adapted under license by [employer] (which disclaims liability or warranty for this information). This care instruction is for use with your licensed healthcare professional. If you have questions about a medical condition or this instruction, always ask your   healthcare professional. Healthwise, Incorporated disclaims any warranty or liability for your use of this information.  Content Version: 11.0.578772; Current as of: June 12, 2015

## 2015-10-13 MED ORDER — SYNTHROID 100 MCG TABLET
100 mcg | ORAL_TABLET | ORAL | 0 refills | Status: DC
Start: 2015-10-13 — End: 2015-11-26

## 2015-10-14 ENCOUNTER — Encounter: Attending: Internal Medicine

## 2015-11-26 ENCOUNTER — Ambulatory Visit: Admit: 2015-11-26 | Payer: MEDICARE | Attending: Internal Medicine

## 2015-11-26 ENCOUNTER — Inpatient Hospital Stay: Admit: 2016-01-30 | Payer: MEDICARE

## 2015-11-26 DIAGNOSIS — Z0000 Encounter for general adult medical examination without abnormal findings: Secondary | ICD-10-CM

## 2015-11-26 DIAGNOSIS — E7800 Pure hypercholesterolemia, unspecified: Secondary | ICD-10-CM

## 2015-11-26 MED ORDER — SYNTHROID 100 MCG TABLET
100 mcg | ORAL_TABLET | ORAL | 3 refills | Status: DC
Start: 2015-11-26 — End: 2016-12-15

## 2015-11-26 MED ORDER — ATORVASTATIN 10 MG TAB
10 mg | ORAL_TABLET | ORAL | 3 refills | Status: DC
Start: 2015-11-26 — End: 2016-12-15

## 2015-11-26 NOTE — Progress Notes (Signed)
Jaime Bailey is a 74 y.o. female who was seen in clinic today (11/26/2015).    Assessment & Plan:   Jaime Bailey was seen today for complete physical.  Diagnoses and all orders for this visit:    Medicare annual wellness visit, subsequent    ACP (advance care planning)    Encounter for immunization    Screening for alcoholism    Screening for depression  -     Depression Screen Annual    Encounter for screening mammogram for malignant neoplasm of breast  -     Bilateral Digital Screening Mammography; Future    Body mass index 30.0-30.9, adult- poorly controlled, worsening, discussed the patient's above normal BMI with her.  The follow up plan is as follows: I have counseled this patient on diet and exercise regimens   -     Intense Behavioral Therapy for Obesity 15 min ([id number])     Pure hypercholesterolemia- previously well controlled, continue current treatment pending review of labs   -     atorvastatin (LIPITOR) 10 mg tablet; TAKE 1 TABLET BY MOUTH DAILY  -     METABOLIC PANEL, COMPREHENSIVE  -     LIPID PANEL    Hypothyroidism due to acquired atrophy of thyroid- previously well controlled, continue current treatment pending review of labs   -     SYNTHROID 100 mcg tablet; TAKE 1 TABLET BY MOUTH EVERY DAY  -     TSH 3RD GENERATION    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        ------------------------------------------------------------------------------------------    Subjective:   Annual Wellness Visit- Subsequent Visit    End of Life Planning: This was discussed with her today and she has an advanced directive - a copy HAS NOT been provided.  Reviewed DNR/DNI and patient is not interested.      Depression Screen:  PHQ 2 / 9, over the last two weeks 11/26/2015   Little interest or pleasure in doing things Not at all   Feeling down, depressed or hopeless Not at all    Total Score PHQ 2 0         Fall Risk:   Fall Risk Assessment, last 12 mths 11/26/2015   Able to walk? Yes   Fall in past 12 months? No   Fall with injury? -   Number of falls in past 12 months -   Fall Risk Score -       Abuse Screen:  Abuse Screening Questionnaire 11/26/2015   Do you ever feel afraid of your partner? N   Are you in a relationship with someone who physically or mentally threatens you? N   Is it safe for you to go home? Y         Alcohol Risk Factor Screening:  On any occasion during the past 3 months, have you had more than 3 drinks containing alcohol?  No  Do you average more than 7 drinks per week?  No    Hearing Loss:  mild, wears hearing aides, denies any hearing loss    Activities of Daily Living:  Self-care.   Requires assistance with: no ADLs    Adult Nutrition Screen:  No risk factors noted.      Health Maintenance  Daily Aspirin: yes  Bone Density: unknown- she reports done  last year (no records available)  Glaucoma Screening: UTD    Immunizations:    Influenza: she will plan on getting it this fall.  Tetanus: reports done at [facility] in [facility].  Shingles: up to date.  Pneumonia: reports done at [facility] in [facility].  Cancer screening:    Cervical: reviewed guidelines, n/a.  Breast: reviewed guidelines, not up to date - will order, reviewed [facility].  Colon: up to date.      Patient Care Team:  Bud Face, MD as PCP - General (Internal Medicine)  Joseph Berkshire, MD (Gastroenterology)  Dorene Grebe IV, MD (Ophthalmology)       In addition to the above issues we also reviewed the following acute and/or chronic problems:    Endocrine Review  Patient is seen for followup of hypothyroidism. Since last visit: no changes. She reports medication compliance: compliant all of the time. She reports the following concerns/problems/med side effects: none. Lab review: labs reviewed and discussed with patient.   ??  Cardiovascular Review   The patient has hyperlipidemia. She reports taking medications as instructed, no medication side effects noted. Diet and Lifestyle: generally follows a low fat low cholesterol diet, sedentary (limited by knee pain). Lab review: labs reviewed and discussed with patient.       The following sections were reviewed & updated as appropriate: PMH, PSH, FH, and SH.      Patient Active Problem List   Diagnosis Code   ??? Hypothyroid E03.9   ??? Hyperlipidemia E78.5   ??? Overweight(278.02)    ??? 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 Authorizing Provider   SYNTHROID 100 mcg tablet TAKE 1 TABLET BY MOUTH EVERY DAY 10/12/15  Yes Bud Face, MD   ACETAMINOPHEN (TYLENOL PO) Take  by mouth.   Yes Historical Provider   atorvastatin (LIPITOR) 10 mg tablet TAKE 1 TABLET BY MOUTH DAILY 12/24/14  Yes Bud Face, MD   naproxen sodium (ALEVE) 220 mg tablet Take 440 mg by mouth two (2) times daily (with meals).   Yes Historical Provider   aspirin delayed-release 81 mg tablet Take 81 mg by mouth daily.   Yes Historical Provider          Allergies   Allergen Reactions   ??? Latex, Natural Rubber Itching            Review of Systems   Constitutional: Negative for chills and fever.   Respiratory: Negative for cough and shortness of breath.    Cardiovascular: Negative for chest pain and palpitations.   Gastrointestinal: Negative for abdominal pain, blood in stool, constipation, diarrhea, heartburn, nausea and vomiting.   Genitourinary: Negative for frequency and urgency.   Musculoskeletal: Positive for joint pain (L knee is unchanged, not giving out or L knee). Negative for myalgias.   Neurological: Negative for tingling, focal weakness and headaches.   Endo/Heme/Allergies: Does not bruise/bleed easily.   Psychiatric/Behavioral: Negative for depression. The patient is not nervous/anxious and does not have insomnia.          Objective:    Physical Exam   Constitutional: She appears well-developed. No distress.   obese   HENT:   Right Ear: Tympanic membrane, external ear and ear canal normal.   Left Ear: Tympanic membrane, external ear and ear canal normal.   Nose: Nose normal.   Mouth/Throat: Uvula is  midline, oropharynx is clear and moist and mucous membranes are normal. No posterior oropharyngeal erythema.   Eyes: Conjunctivae and lids are normal. No scleral icterus.   Neck: Neck supple. Carotid bruit is not present. No thyromegaly present.   Cardiovascular: Regular rhythm and normal heart sounds.    No murmur heard.  Pulses:       Dorsalis pedis pulses are 2+ on the right side, and 2+ on the left side.        Posterior tibial pulses are 2+ on the right side, and 2+ on the left side.   Pulmonary/Chest: Effort normal and breath sounds normal. She has no wheezes. She has no rales.   Abdominal: Soft. Bowel sounds are normal. She exhibits no mass. There is no hepatosplenomegaly. There is no tenderness.   Musculoskeletal: Normal range of motion. She exhibits no edema.        Cervical back: Normal.        Thoracic back: She exhibits no bony tenderness.        Lumbar back: Normal.   Lymphadenopathy:     She has no cervical adenopathy.   Neurological: She has normal strength. No cranial nerve deficit or sensory deficit.   Skin: No rash noted.   Psychiatric: She has a normal mood and affect. Her behavior is normal.          Visit Vitals   ??? BP 108/78   ??? Pulse 80   ??? Temp 98.4 ??F (36.9 ??C) (Oral)   ??? Resp 16   ??? Ht 5' 5.45" (1.662 m)   ??? Wt 184 lb (83.5 kg)   ??? SpO2 95%   ??? BMI 30.2 kg/m2          Advised her to call back or return to office if symptoms worsen/change/persist.  Discussed expected course/resolution/complications of diagnosis in detail with patient.    Medication risks/benefits/costs/interactions/alternatives discussed with patient.  She was given an after visit summary which includes diagnoses, current medications, & vitals.   She expressed understanding with the diagnosis and plan.        Bud Face, MD

## 2015-11-26 NOTE — Progress Notes (Signed)
Annual wellness.  [place]ap [place]ar in [place], will call for confirmation.  C/O some fatigue and chills.

## 2015-11-26 NOTE — ACP (Advance Care Planning) (Signed)
Advance Care Planning    Advance Care Planning (ACP) Provider Note - Comprehensive     Date of ACP Conversation: 11/26/15  Persons included in Conversation:  patient  Length of ACP Conversation in minutes:  10 minutes    Authorized Management consultantDecision Maker (if patient is incapable of making informed decisions):   This person is:  IT sales professionalHealthcare Agent/Medical Power of Attorney under Advance Directive          General ACP for ALL Patients with Decision Making Capacity:   Importance of advance care planning, including choosing a healthcare agent to communicate patient's healthcare decisions if patient lost the ability to make decisions, such as after a sudden illness or accident  Understanding of the healthcare agent role was assessed and information provided  Opportunity offered to explore how cultural, religious, spiritual, or personal beliefs would affect decisions for future care     Review of Existing Advance Directive:  Does this advance directive still reflect your preferences?  Yes (Provide new form/Refer for assistance in updating)- she will bring in a copy for her record    For Serious or Chronic Illness:  Understanding of medical condition    Understanding of CPR, goals and expected outcomes, benefits and burdens discussed.  Information on CPR success rates provided (e.g. for CPR in hospital, survival to d/c at two weeks is 22%, for chronically ill or elderly/frail survival is less than 3%); Individual asked to communicate understanding of information in his/her own words.    Interventions Provided:  Recommended communicating the plan and making copies for the healthcare agent, personal physician, and others as appropriate (e.g., health system)  Recommended review of completed ACP document annually or upon change in health status

## 2015-11-26 NOTE — Patient Instructions (Signed)
Medicare Wellness Visit, Female    The best way to live healthy is to have a healthy lifestyle by eating a well-balanced diet, exercising regularly, limiting alcohol and stopping smoking.    Regular physical exams and screening tests are another way to keep healthy. Preventive exams provided by your health care provider can find health problems before they become diseases or illnesses. Preventive services including immunizations, screening tests, monitoring and exams can help you take care of your own health.    All people over age 19 should have a pneumovax  and and a prevnar shot to prevent pneumonia. These are once in a lifetime unless you and your provider decide differently.    All people over 65 should have a yearly flu shot and a tetanus vaccine every 10 years.    A bone mass density to screen for osteoporosis or thinning of the bones should be done every 2 years after 65.    Screening for diabetes mellitus with a blood sugar test should be done every year.    Glaucoma is a disease of the eye due to increased ocular pressure that can lead to blindness and it should be done every year by an eye professional.    Cardiovascular screening tests that check for elevated lipids (fatty part of blood) which can lead to heart disease and strokes should be done every 5 years.    Colorectal screening that evaluates for blood or polyps in your colon should be done yearly as a stool test or every five years as a flexible sigmoidoscope or every 10 years as a colonoscopy up to age 32.    Breast cancer screening with a mammogram is recommended biennially  for women age 12-74.    Screening for cervical cancer with a pap smear and pelvic exam is recommended for women after age 49 years every 2 years up to age 71 or when the provider and patient decide to stop.If there is a history of cervical abnormalities or other increased risk for cancer then the test is recommended yearly.     Hepatitis C screening is also recommended for anyone born between 2 through 1965.    A shingles vaccine is also recommended once in a lifetime after age 26.    Your Medicare Wellness Exam is recommended annually.    Here is a list of your current Health Maintenance items with a due date:  Health Maintenance Due   Topic Date Due   ??? DTaP/Tdap/Td  (2 - Td) 12/13/2010   ??? Annual Well Visit  10/12/2015         WEIGHT LOSS RECOMMENDATIONS:    Jaime Bailey:               - [address]  [place], [place]   - [phone]   - [email or web address]   - 3 month initial course   - Initial fee + monthly membership fee    [facility]   - Dr Valere Dross   - [address]  [place], [place]   - [phone]   - [email or web address]   - Cost: $135 initial visit, $65 follow up visits    [facility] PREP Program (Physician Recommend Exercise Program    [facility] - Patient Engagement Workshop: Healthy Lifestyle   -Run by Dr Donata Duff   -Located at [facility]   -Once a month sessions, 10-12 patients   -FREE    Weight Watchers:   - See website  Doylene BodeJenny Craig:   - See website    New Technology:   - My Fitness Pal or other Apps for your phone   - FitBit or FuelBand      Aerobic exercise: goal of 3-5 times per week, about 30 minutes    Diet changes: limiting daily calorie intake to 2,000.  Work on reading nutrition labels on food (in particular the serving size, the calories per serving, and carbohydrates).  Work on decreasing portion sizes & snacking.       Advance Directives: Care Instructions  Your Care Instructions  An advance directive is a legal way to state your wishes at the end of your life. It tells your family and your doctor what to do if you can no longer say what you want.  There are two main types of advance directives. You can change them any time that your wishes change.  ?? A living will tells your family and your doctor your wishes about life  support and other treatment.  ?? A medical power of attorney lets you name a person to make treatment decisions for you when you can't speak for yourself. This person is called a health care agent.  If you do not have an advance directive, decisions about your medical care may be made by a doctor or a judge who doesn't know you.  It may help to think of an advance directive as a gift to the people who care for you. If you have one, they won't have to make tough decisions by themselves.  Follow-up care is a key part of your treatment and safety. Be sure to make and go to all appointments, and call your doctor if you are having problems. It's also a good idea to know your test results and keep a list of the medicines you take.  How can you care for yourself at home?  ?? Discuss your wishes with your loved ones and your doctor. This way, there are no surprises.  ?? Many states have a unique form. Or you might use a universal form that has been approved by many states. This kind of form can sometimes be completed and stored online. Your electronic copy will then be available wherever you have a connection to the Internet. In most cases, doctors will respect your wishes even if you have a form from a different state.  ?? You don't need a lawyer to do an advance directive. But you may want to get legal advice.  ?? Think about these questions when you prepare an advance directive:  ?? Who do you want to make decisions about your medical care if you are not able to? Many people choose a family member, close friend, or doctor.  ?? Do you know enough about life support methods that might be used? If not, talk to your doctor so you understand.  ?? What are you most afraid of that might happen? You might be afraid of having pain, losing your independence, or being kept alive by machines.  ?? Where would you prefer to die? Choices include your home, a hospital, or a nursing home.   ?? Would you like to have information about hospice care to support you and your family?  ?? Do you want to donate organs when you die?  ?? Do you want certain religious practices performed before you die? If so, put your wishes in the advance directive.  ?? Read your advance directive every year, and make changes as needed.  When should you call for help?  Be sure to contact your doctor if you have any questions.  Where can you learn more?  Go to [employer]  Enter R264 in the search box to learn more about "Advance Directives: Care Instructions."  ?? 2006-2016 [employer]. Care instructions adapted under license by [employer] (which disclaims liability or warranty for this information). This care instruction is for use with your licensed healthcare professional. If you have questions about a medical condition or this instruction, always ask your healthcare professional. Healthwise, Incorporated disclaims any warranty or liability for your use of this information.  Content Version: 11.0.578772; Current as of: February 05, 2015

## 2015-11-27 LAB — METABOLIC PANEL, COMPREHENSIVE
A-G Ratio: 2 (ref 1.1–2.5)
ALT (SGPT): 13 IU/L (ref 0–32)
AST (SGOT): 14 IU/L (ref 0–40)
Albumin: 4.3 g/dL (ref 3.5–4.8)
Alk. phosphatase: 82 IU/L (ref 39–117)
BUN/Creatinine ratio: 15 (ref 11–26)
BUN: 10 mg/dL (ref 8–27)
Bilirubin, total: 0.6 mg/dL (ref 0.0–1.2)
CO2: 24 mmol/L (ref 18–29)
Calcium: 9.8 mg/dL (ref 8.7–10.3)
Chloride: 102 mmol/L (ref 96–106)
Creatinine: 0.67 mg/dL (ref 0.57–1.00)
GFR est AA: 101 mL/min/{1.73_m2} (ref >59)
GFR est non-AA: 87 mL/min/{1.73_m2} (ref >59)
GLOBULIN, TOTAL: 2.2 g/dL (ref 1.5–4.5)
Glucose: 97 mg/dL (ref 65–99)
Potassium: 3.9 mmol/L (ref 3.5–5.2)
Protein, total: 6.5 g/dL (ref 6.0–8.5)
Sodium: 140 mmol/L (ref 134–144)

## 2015-11-27 LAB — LIPID PANEL
Cholesterol, total: 172 mg/dL (ref 100–199)
HDL Cholesterol: 44 mg/dL (ref >39)
LDL, calculated: 95 mg/dL (ref 0–99)
Triglyceride: 166 mg/dL — ABNORMAL HIGH (ref 0–149)
VLDL, calculated: 33 mg/dL (ref 5–40)

## 2015-11-27 LAB — TSH 3RD GENERATION: TSH: 1.03 u[IU]/mL (ref 0.450–4.500)

## 2015-11-27 NOTE — Progress Notes (Signed)
Results released to patient via MyChart.  All labs are stable or at goal for her.

## 2016-01-16 ENCOUNTER — Inpatient Hospital Stay: Admit: 2016-01-16 | Payer: MEDICARE | Attending: Internal Medicine

## 2016-01-16 DIAGNOSIS — Z1231 Encounter for screening mammogram for malignant neoplasm of breast: Secondary | ICD-10-CM

## 2016-08-05 ENCOUNTER — Encounter: Attending: Internal Medicine

## 2016-08-09 ENCOUNTER — Ambulatory Visit: Admit: 2016-08-09 | Discharge: 2016-08-09 | Payer: MEDICARE | Attending: Internal Medicine

## 2016-08-09 DIAGNOSIS — E034 Atrophy of thyroid (acquired): Secondary | ICD-10-CM

## 2016-08-09 MED ORDER — OMEPRAZOLE 40 MG CAP, DELAYED RELEASE
40 mg | ORAL_CAPSULE | Freq: Every day | ORAL | 0 refills | Status: DC
Start: 2016-08-09 — End: 2017-06-21

## 2016-08-09 NOTE — Progress Notes (Signed)
Jaime Bailey L Brunn is a 75 y.o. female who was seen in clinic today (08/09/2016).      Assessment & Plan:  Diagnoses and all orders for this visit:    1. Hypothyroidism due to acquired atrophy of thyroid- well controlled, continue current treatment pending review of labs   -     TSH 3RD GENERATION    2. Pure hypercholesterolemia- at goal, continue current treatment pending review of labs   -     METABOLIC PANEL, COMPREHENSIVE  -     LIPID PANEL    3. Non morbid obesity due to excess calories- stable, needs improvement, I have reviewed/discussed the above normal BMI with the patient.  I have recommended the following interventions: encourage exercise and lifestyle education regarding diet.     4. Gastroesophageal reflux disease, esophagitis presence not specified- new diagnosis, reviewed differential, and this could explain her swallowing and CP symptoms.  Will start on meds below.  Reviewed diet triggers to avoid.  She will update me in 2???3 weeks  -     omeprazole (PRILOSEC) 40 mg capsule; Take 1 Cap by mouth daily.    5. Family history of abdominal aortic aneurysm (AAA) repair  -     DUPLEX AORTA/IVC/ILIAC/GRAFTS COMPLETE; Future         Follow-up Disposition:  Return in about 8 months (around 04/09/2017) for FULL PHYSICAL - 30 minutes.       ----------------------------------------------------------------------    Subjective:  Jaime Bailey was seen today for Hypothyroidism; Cholesterol Problem; and Weight Management    Endocrine Review  Patient is seen for followup of hypothyroidism.  Since last visit: no changes.  She reports medication compliance: all the time and is taking separate from all her other meds.  She reports the following concerns/problems/med side effects: none.  Lab review: labs reviewed and discussed with patient.      Cardiovascular Review  The patient has hypertension and obesity.  Since last visit: no changes.  She reports taking medications as instructed, no medication side effects  noted.  Diet and Lifestyle: generally follows a low fat low cholesterol diet, exercises sporadically.  Labs: reviewed and discussed with patient.        Lab Results   Component Value Date/Time    Sodium 140 11/26/2015 12:00 PM    Potassium 3.9 11/26/2015 12:00 PM    Cholesterol, total 172 11/26/2015 12:00 PM    LDL, calculated 95 11/26/2015 12:00 PM    Triglyceride 166 11/26/2015 12:00 PM           ----------------------------------------------------------------------      Prior to Admission medications    Medication Sig Start Date End Date Taking? Authorizing Provider   SYNTHROID 100 mcg tablet TAKE 1 TABLET BY MOUTH EVERY DAY 11/26/15  Yes Bud Facehristopher P Xitlalic Maslin, MD   atorvastatin (LIPITOR) 10 mg tablet TAKE 1 TABLET BY MOUTH DAILY 11/26/15  Yes Bud Facehristopher P Remona Boom, MD   naproxen sodium (ALEVE) 220 mg tablet Take 440 mg by mouth two (2) times daily (with meals).   Yes Historical Provider   aspirin delayed-release 81 mg tablet Take 81 mg by mouth daily.   Yes Historical Provider          Allergies   Allergen Reactions   ??? Latex, Natural Rubber Itching           Review of Systems   Constitutional: Negative for malaise/fatigue and weight loss.   HENT:        Has had a URI for the  last 2 wks, it is improving   Respiratory: Negative for cough and shortness of breath.    Cardiovascular: Negative for chest pain, palpitations and leg swelling.        Brother w/ AAA repair.  She reports screening AAA a few years ago and normal.   Gastrointestinal: Negative for abdominal pain, constipation, diarrhea, heartburn, nausea and vomiting.        She reports over the last 8-9 months noticing some trouble coughing, needs to be careful w/ drinking.  Only w/ liquids.  Never w/ food.    CP and burping.  This is new in the few months.  Mostly at night.  Has increased number of pillows at night and has used tums w/ good relief.  Diet is worse.   Genitourinary: Negative for frequency.    Musculoskeletal: Negative for joint pain and myalgias.   Skin: Negative for rash.   Neurological: Negative for tingling, sensory change, focal weakness and headaches.   Psychiatric/Behavioral: Negative for depression. The patient is not nervous/anxious and does not have insomnia.              Objective:   Physical Exam   Constitutional: No distress.   HENT:   Right Ear: Tympanic membrane is not erythematous and not bulging. No middle ear effusion.   Left Ear: Tympanic membrane is not erythematous and not bulging.  No middle ear effusion.   Nose: No mucosal edema or rhinorrhea. Right sinus exhibits no maxillary sinus tenderness and no frontal sinus tenderness. Left sinus exhibits no maxillary sinus tenderness and no frontal sinus tenderness.   Mouth/Throat: Uvula is midline and mucous membranes are normal. No oropharyngeal exudate or posterior oropharyngeal erythema.   Eyes: Conjunctivae are normal. No scleral icterus.   Neck: Neck supple. No thyromegaly present.   Cardiovascular: Regular rhythm and normal heart sounds.    No murmur heard.  Pulmonary/Chest: Effort normal and breath sounds normal. She has no wheezes. She has no rales.   Abdominal: Bowel sounds are normal. She exhibits no pulsatile midline mass and no mass. There is no hepatosplenomegaly. There is no tenderness.   Musculoskeletal: She exhibits no edema.   Lymphadenopathy:     She has no cervical adenopathy.   Psychiatric: She has a normal mood and affect. Her behavior is normal.         Visit Vitals   ??? BP 112/88   ??? Pulse 64   ??? Temp 98.6 ??F (37 ??C) (Oral)   ??? Resp 14   ??? Ht 5' 5.45" (1.662 m)   ??? Wt 187 lb 6.4 oz (85 kg)   ??? SpO2 97%   ??? BMI 30.76 kg/m2         Disclaimer:  Advised her to call back or return to office if symptoms worsen/change/persist.  Discussed expected course/resolution/complications of diagnosis in detail with patient.    Medication risks/benefits/costs/interactions/alternatives discussed with patient.   She was given an after visit summary which includes diagnoses, current medications, & vitals.  She expressed understanding with the diagnosis and plan.        Bud Face, MD

## 2016-08-09 NOTE — Progress Notes (Signed)
Follow up. Would like to discuss swallowing, feet numbness, recent viral infection, reflux. Has family h/o aortic stents, asking about ultrasound.  Asking about a CXR.

## 2016-08-16 NOTE — Telephone Encounter (Signed)
Patient calls with sore throat for the last 2 days. Was seen in the office earlier this week for other issues. Has some mild congestion. No fever or chills or SOB or wheeze. No appetite issues. Has been using some advil. Will increase advil and use salt water gargles and let me know if not improving. Call for fever, chills or wheeze.

## 2016-08-17 NOTE — Telephone Encounter (Signed)
Please call patient.  If she feels like her symptoms are getting worse then would treat as if it has progressed to bacterial infection, amoxicillin 875 mg 1 tab twice daily, #14, RF 0

## 2016-08-17 NOTE — Telephone Encounter (Signed)
Called pt and left message to call back.

## 2016-08-18 NOTE — Telephone Encounter (Signed)
Called pt and pt states she is feeling much better now and feels she does not need the abt at this time.

## 2016-08-18 NOTE — Telephone Encounter (Signed)
Patient returned [employer] call.

## 2016-08-23 ENCOUNTER — Telehealth

## 2016-08-23 ENCOUNTER — Encounter

## 2016-08-23 ENCOUNTER — Inpatient Hospital Stay: Admit: 2016-08-23 | Payer: MEDICARE | Attending: Internal Medicine

## 2016-08-23 ENCOUNTER — Inpatient Hospital Stay: Payer: MEDICARE | Attending: Internal Medicine

## 2016-08-23 DIAGNOSIS — Z136 Encounter for screening for cardiovascular disorders: Secondary | ICD-10-CM

## 2016-08-23 DIAGNOSIS — Z8249 Family history of ischemic heart disease and other diseases of the circulatory system: Secondary | ICD-10-CM

## 2016-09-06 NOTE — Telephone Encounter (Signed)
Called pt and gave results of US of aorta. Pt verbalized understanding.

## 2016-09-06 NOTE — Telephone Encounter (Signed)
[phone] ultra sound results

## 2016-10-04 LAB — AMB EXT EJECTION FRACTION
EF %, External: >50
EF %, External: >50

## 2016-10-05 LAB — AMB EXT CREATININE: Creatinine, External: 0.6

## 2016-10-05 LAB — AMB EXT HGBA1C: Hemoglobin A1c, External: 5.3

## 2016-10-05 LAB — AMB EXT LDL-C: LDL-C, External: 97

## 2016-10-27 NOTE — Progress Notes (Signed)
[facility]    [facility] for Global Transient Amnesia about 2.5 weeks ago.      Received call from pt's husband, Jaime Bailey.  Corky SingVerified Jaime Bailey with 2 identifiers.  Pt was admitted for 2 days with what they thought was a stroke.  Was seen by Neurologist during admission.  She was dx with GTA.  Says all her symptoms have resolved.  She wasn't started on any new medications. Meds were reviewed.  She does take an ASA 81mg .  Attempted to schedule an appt for next week, but states they couldn't come in until November 15, 2016.  Jaime Bailey will bring in d/c paperwork with him.      Goals     ??? Understands red flags post discharge.            10/27/16 - Pt will not have any further neurological issues x 30 days.  Monitor for word salad, aphasia, slurred speech, inc memory deficits.  Jaime Arnt Kaicee Scarpino, RN, BSN, CCM  Nurse Navigator  [facility]

## 2016-11-15 ENCOUNTER — Ambulatory Visit: Admit: 2016-11-15 | Discharge: 2016-11-15 | Payer: MEDICARE | Attending: Internal Medicine

## 2016-11-15 DIAGNOSIS — G454 Transient global amnesia: Secondary | ICD-10-CM

## 2016-11-15 NOTE — Progress Notes (Signed)
Jaime Bailey is a 75 y.o. female who was seen in clinic today (11/15/2016).      Assessment & Plan:   Diagnoses and all orders for this visit:    1. 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  Reviewed TIA vs CVA.  Will not make any changes.  Will continue to monitor.  Red flags were reviewed with the patient to RTC or notify me.     2. Gastroesophageal reflux disease, esophagitis presence not specified- well controlled, continue current treatment     3. Pure hypercholesterolemia- at goal, continue current treatment, recent labs reviewed     4. Hypothyroidism due to acquired atrophy of thyroid- well controlled, recent labs wnl, continue current treatment     5. Obesity (BMI 30.0-34.9)- stable, needs improvement, I have reviewed/discussed the above normal BMI with the patient.  I have recommended the following interventions: encourage exercise and lifestyle education regarding diet.          Follow-up Disposition:  Return in about 1 month (around 12/16/2016) for FULL PHYSICAL - 30 minutes.        Subjective:   Jaime Bailey was seen today for Hospital Follow Up; Hypothyroidism; and Cholesterol Problem    Hospital Follow Up  Jaime Bailey is seen for follow up from recent admission to an outside hospital in October 2017.  She had an episode of memory loss, lasted 5-6 hrs.  Initially started w/ babbling and a lot of questions.  Neurologically w/ no deficits.  We reviewed the the records.      GI Review  Patient complains of GERD.  She denies: abdominal bloating, heartburn, midepigastric pain and nausea.  She has identified the following triggers: nothing.  Medical therapy currently involves Prilosec.      Endocrine Review  Patient is seen for followup of hypothyroidism.  Since last visit: no changes.  She reports medication compliance: all the time and is taking separate from all her other meds.  She reports the following  concerns/problems/med side effects: none.  Lab review: labs reviewed and discussed with patient.      Cardiovascular Review  The patient has hyperlipidemia and obesity.  Since last visit: no changes.  She reports taking medications as instructed, no medication side effects noted.  Diet and Lifestyle: generally follows a low fat low cholesterol diet, generally follows a low sodium diet, exercises regularly.  Labs: reviewed and discussed with patient.             Prior to Admission medications    Medication Sig Start Date End Date Taking? Authorizing Provider   omeprazole (PRILOSEC) 40 mg capsule Take 1 Cap by mouth daily. 08/09/16  Yes Bud Facehristopher P Caelin Rosen, MD   SYNTHROID 100 mcg tablet TAKE 1 TABLET BY MOUTH EVERY DAY 11/26/15  Yes Bud Facehristopher P Kylar Leonhardt, MD   atorvastatin (LIPITOR) 10 mg tablet TAKE 1 TABLET BY MOUTH DAILY 11/26/15  Yes Bud Facehristopher P Bassheva Flury, MD   naproxen sodium (ALEVE) 220 mg tablet Take 440 mg by mouth two (2) times daily (with meals).   Yes Historical Provider   aspirin delayed-release 81 mg tablet Take 81 mg by mouth daily.   Yes Historical Provider          Allergies   Allergen Reactions   ??? Latex, Natural Rubber Itching           Review of Systems   Constitutional: Negative for malaise/fatigue and weight  loss.   Respiratory: Negative for cough and shortness of breath.    Cardiovascular: Negative for chest pain, palpitations and leg swelling.   Gastrointestinal: Negative for abdominal pain, constipation, diarrhea, heartburn, nausea and vomiting.   Genitourinary: Negative for frequency.   Musculoskeletal: Positive for joint pain (bilateral knee pain). Negative for myalgias.   Skin: Negative for rash.   Neurological: Negative for tingling, sensory change, focal weakness and headaches.   Psychiatric/Behavioral: Negative for depression. The patient is not nervous/anxious and does not have insomnia.             Objective:   Physical Exam   Constitutional: No distress.   obese    Eyes: Conjunctivae are normal. No scleral icterus.   Neck: No thyromegaly present.   Cardiovascular: Regular rhythm and normal heart sounds.    No murmur heard.  Pulmonary/Chest: Effort normal and breath sounds normal. She has no wheezes. She has no rales.   Abdominal: Bowel sounds are normal. She exhibits no mass. There is no hepatosplenomegaly. There is no tenderness.   Musculoskeletal: She exhibits no edema.   Lymphadenopathy:     She has no cervical adenopathy.   Psychiatric: She has a normal mood and affect. Her behavior is normal.         Visit Vitals   ??? BP 128/86   ??? Pulse 62   ??? Temp 98.1 ??F (36.7 ??C) (Oral)   ??? Resp 14   ??? Ht 5' 5.45" (1.662 m)   ??? Wt 187 lb (84.8 kg)   ??? SpO2 97%   ??? BMI 30.69 kg/m2         Disclaimer:  Advised her to call back or return to office if symptoms worsen/change/persist.  Discussed expected course/resolution/complications of diagnosis in detail with patient.    Medication risks/benefits/costs/interactions/alternatives discussed with patient.  She was given an after visit summary which includes diagnoses, current medications, & vitals.  She expressed understanding with the diagnosis and plan.      Aspects of this note may have been generated using voice recognition software. Despite editing, there may be some syntax errors.       Bud Facehristopher P Jania Steinke, MD

## 2016-11-15 NOTE — Progress Notes (Signed)
Follow up.

## 2016-11-26 NOTE — Progress Notes (Signed)
NNTOCIP    Hospital Follow Up Phone Call # 1    Called pt.  Verified her with 2 identifiers.  [facility] Madilyn FiremanHayes [facility] 11/[facility]/16.   Denies any further issues.  Verified that she is coming in for her MWV on 12/15/16.    Progress Towards Goals:    Goals     ??? Understands red flags post discharge.            10/27/16 - Pt will not have any further neurological issues x 30 days.  Monitor for word salad, aphasia, slurred speech, inc memory deficits.  Leamon Arnt Arcenio Mullaly, RN, BSN, CCM  Nurse Navigator  [facility]

## 2016-12-15 ENCOUNTER — Ambulatory Visit: Admit: 2016-12-15 | Payer: MEDICARE | Attending: Internal Medicine

## 2016-12-15 DIAGNOSIS — Z0000 Encounter for general adult medical examination without abnormal findings: Secondary | ICD-10-CM

## 2016-12-15 MED ORDER — ATORVASTATIN 10 MG TAB
10 mg | ORAL_TABLET | ORAL | 3 refills | Status: DC
Start: 2016-12-15 — End: 2018-02-11

## 2016-12-15 MED ORDER — SYNTHROID 100 MCG TABLET
100 mcg | ORAL_TABLET | ORAL | 3 refills | Status: DC
Start: 2016-12-15 — End: 2018-02-16

## 2016-12-15 NOTE — ACP (Advance Care Planning) (Signed)
Advance Care Planning (ACP) Provider Note - Comprehensive     Date of ACP Conversation: 12/15/16  Persons included in Conversation:  patient  Length of ACP Conversation in minutes: <16 minutes (Non-Billable)    Authorized Management consultantDecision Maker (if patient is incapable of making informed decisions):   This person is: IT sales professionalHealthcare Agent/Medical Power of Attorney under Advance Directive          General ACP for ALL Patients with Decision Making Capacity:  Importance of advance care planning, including choosing a healthcare agent to communicate patient's healthcare decisions if patient lost the ability to make decisions, such as after a sudden illness or accident  Understanding of the healthcare agent role was assessed and information provided  Opportunity offered to explore how cultural, religious, spiritual, or personal beliefs would affect decisions for future care  Exploration of values, goals, and preferences if recovery is not expected, even with continued medical treatment in the event of: Imminent death or severe, permanent brain injury    For Serious or Chronic Illness:  Understanding of CPR, goals and expected outcomes, benefits and burdens discussed.  Understanding of medical condition  Information on CPR success rates provided (e.g. for CPR in hospital, survival to d/c at two weeks is 22%, for chronically ill or elderly/frail survival is less than 3%)    Interventions Provided:  Recommended communicating the plan and making copies for the healthcare agent, personal physician, and others as appropriate (e.g., health system)  Recommended review of completed ACP document annually or upon change in health status       She has an advanced directive - a copy HAS NOT been provided.  Reviewed DNR/DNI and patient is not interested.

## 2016-12-15 NOTE — Progress Notes (Signed)
Annual wellness. Stopped Atorvastatin in December. Last took Synthroid 3 days ago, taking levothyroxine. Reports some problem with left eye, saw Dr. Roxan Hockeyobinson.

## 2016-12-15 NOTE — Patient Instructions (Signed)
Medicare Wellness Visit, Female    The best way to live healthy is to have a healthy lifestyle by eating a well-balanced diet, exercising regularly, limiting alcohol and stopping smoking.    Regular physical exams and screening tests are another way to keep healthy. Preventive exams provided by your health care provider can find health problems before they become diseases or illnesses. Preventive services including immunizations, screening tests, monitoring and exams can help you take care of your own health.    All people over age 75 should have a pneumovax  and and a prevnar shot to prevent pneumonia. These are once in a lifetime unless you and your provider decide differently.    All people over 65 should have a yearly flu shot and a tetanus vaccine every 10 years.    A bone mass density to screen for osteoporosis or thinning of the bones should be done every 2 years after 65.    Screening for diabetes mellitus with a blood sugar test should be done every year.    Glaucoma is a disease of the eye due to increased ocular pressure that can lead to blindness and it should be done every year by an eye professional.    Cardiovascular screening tests that check for elevated lipids (fatty part of blood) which can lead to heart disease and strokes should be done every 5 years.    Colorectal screening that evaluates for blood or polyps in your colon should be done yearly as a stool test or every five years as a flexible sigmoidoscope or every 10 years as a colonoscopy up to age 75.    Breast cancer screening with a mammogram is recommended biennially  for women age 75-74.    Screening for cervical cancer with a pap smear and pelvic exam is recommended for women after age 75 years every 2 years up to age 75 or when the provider and patient decide to stop.If there is a history of cervical abnormalities or other increased risk for cancer then the test is recommended yearly.     Hepatitis C screening is also recommended for anyone born between [id number] through 1965.    A shingles vaccine is also recommended once in a lifetime after age 75.    Your Medicare Wellness Exam is recommended annually.    Here is a list of your current Health Maintenance items with a due date:  Health Maintenance Due   Topic Date Due   ??? Annual Well Visit  11/26/2016            Learning About Living Jaime Bailey  What is a living will?  A living will is a legal form you use to write down the kind of care you want at the end of your life. It is used by the health professionals who will treat you if you aren't able to decide for yourself.  If you put your wishes in writing, your loved ones and others will know what kind of care you want. They won't need to guess. This can ease your mind and be helpful to others.  A living will is not the same as an estate or property will. An estate will explains what you want to happen with your money and property after you die.  Is a living will a legal document?  A living will is a legal document. Each state has its own laws about living wills. If you move to another state, make sure that your living will is legal  in the state where you now live. Or you might use a universal form that has been approved by many states. This kind of form can sometimes be completed and stored online. Your electronic copy will then be available wherever you have a connection to the Internet. In most cases, doctors will respect your wishes even if you have a form from a different state.  ?? You don't need an attorney to complete a living will. But legal advice can be helpful if your state's laws are unclear, your health history is complicated, or your family can't agree on what should be in your living will.  ?? You can change your living will at any time. Some people find that their wishes about end-of-life care change as their health changes.   ?? In addition to making a living will, think about completing a medical power of attorney form. This form lets you name the person you want to make end-of-life treatment decisions for you (your "health care agent") if you're not able to. Many hospitals and nursing homes will give you the forms you need to complete a living will and a medical power of attorney.  ?? Your living will is used only if you can't make or communicate decisions for yourself anymore. If you become able to make decisions again, you can accept or refuse any treatment, no matter what you wrote in your living will.  ?? Your state may offer an online registry. This is a place where you can store your living will online so the doctors and nurses who need to treat you can find it right away.  What should you think about when creating a living will?  Talk about your end-of-life wishes with your family members and your doctor. Let them know what you want. That way the people making decisions for you won't be surprised by your choices.  Think about these questions as you make your living will:  ?? Do you know enough about life support methods that might be used? If not, talk to your doctor so you know what might be done if you can't breathe on your own, your heart stops, or you're unable to swallow.  ?? What things would you still want to be able to do after you receive life-support methods? Would you want to be able to walk? To speak? To eat on your own? To live without the help of machines?  ?? If you have a choice, where do you want to be cared for? In your home? At a hospital or nursing home?  ?? Do you want certain religious practices performed if you become very ill?  ?? If you have a choice at the end of your life, where would you prefer to die? At home? In a hospital or nursing home? Somewhere else?  ?? Would you prefer to be buried or cremated?  ?? Do you want your organs to be donated after you die?  What should you do with your living will?   ?? Make sure that your family members and your health care agent have copies of your living will.  ?? Give your doctor a copy of your living will to keep in your medical record. If you have more than one doctor, make sure that each one has a copy.  ?? You may want to put a copy of your living will where it can be easily found.  Where can you learn more?  Go to [employer].  Enter [phone] in the search  box to learn more about "Learning About Living Wills."  Current as of: July 21, 2015  Content Version: 11.3  ?? 2006-2017 [employer]. Care instructions adapted under license by [employer] (which disclaims liability or warranty for this information). If you have questions about a medical condition or this instruction, always ask your healthcare professional. [place] any warranty or liability for your use of this information.

## 2016-12-15 NOTE — ACP (Advance Care Planning) (Signed)
Advance Care Planning (ACP) Provider Note - Comprehensive     Date of ACP Conversation: 12/15/16  Persons included in Conversation:  patient  Length of ACP Conversation in minutes: <16 minutes (Non-Billable)    Authorized Decision Maker (if patient is incapable of making informed decisions):   This person is: Healthcare Agent/Medical Power of Attorney under Advance Directive          General ACP for ALL Patients with Decision Making Capacity:  Importance of advance care planning, including choosing a healthcare agent to communicate patient's healthcare decisions if patient lost the ability to make decisions, such as after a sudden illness or accident  Understanding of the healthcare agent role was assessed and information provided  Opportunity offered to explore how cultural, religious, spiritual, or personal beliefs would affect decisions for future care  Exploration of values, goals, and preferences if recovery is not expected, even with continued medical treatment in the event of: Imminent death or severe, permanent brain injury    For Serious or Chronic Illness:  Understanding of CPR, goals and expected outcomes, benefits and burdens discussed.  Understanding of medical condition  Information on CPR success rates provided (e.g. for CPR in hospital, survival to d/c at two weeks is 22%, for chronically ill or elderly/frail survival is less than 3%)    Interventions Provided:  Recommended communicating the plan and making copies for the healthcare agent, personal physician, and others as appropriate (e.g., health system)  Recommended review of completed ACP document annually or upon change in health status       She has an advanced directive - a copy HAS NOT been provided.  Reviewed DNR/DNI and patient is not interested.

## 2016-12-15 NOTE — Progress Notes (Signed)
Jaime Bailey is a 76 y.o. female who was seen in clinic today (12/15/2016) for a full physical.      Assessment & Plan:   Diagnoses and all orders for this visit:    1. Medicare annual wellness visit, subsequent    2. ACP (advance care planning)    3. Screening for alcoholism  -     Annual  Alcohol Screen 15 min ([id number]([id number])    4. Encounter for screening mammogram for malignant neoplasm of breast  -     Bilateral Digital Screening Mammography; Future    5. Disorder of bone and cartilage  -     Dexa Bone Density Study Axial ([id number]); Future    6. Body mass index 30.0-30.9, adult- stable, needs improvement, I have reviewed/discussed the above normal BMI with the patient.  I have recommended the following interventions: encourage exercise and lifestyle education regarding diet.     7. Hypothyroidism due to acquired atrophy of thyroid- well controlled, continue current treatment   -     SYNTHROID 100 mcg tablet; TAKE 1 TABLET BY MOUTH EVERY DAY    8. Pure hypercholesterolemia- at goal, continue current treatment (she will restart meds)  -     atorvastatin (LIPITOR) 10 mg tablet; TAKE 1 TABLET BY MOUTH DAILY    9. 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  -     REFERRAL TO PSYCHOLOGY    10. Viral upper respiratory tract infection- discussed diagnosis & treatment options, favor this is viral at this time, reviewed the importance of avoiding unnecessary antibiotic therapy, reviewed which OTC medications to use and avoid, expected time course for resolution & red flags were reviewed with her to RTC or notify me.          Follow-up Disposition: 1 year for CPE       ------------------------------------------------------------------------------------------    Subjective:   Annual Wellness Visit- Subsequent Visit    End of Life Planning: This was discussed with her today and she has an  advanced directive - a copy HAS NOT been provided.  Reviewed DNR/DNI and patient is not interested.      Depression Screen:  PHQ over the last two weeks 12/15/2016   Little interest or pleasure in doing things Not at all   Feeling down, depressed or hopeless Not at all   Total Score PHQ 2 0         Fall Risk:   Fall Risk Assessment, last 12 mths 12/15/2016   Able to walk? Yes   Fall in past 12 months? No   Fall with injury? -   Number of falls in past 12 months -   Fall Risk Score -       Abuse Screen:  Abuse Screening Questionnaire 12/15/2016   Do you ever feel afraid of your partner? N   Are you in a relationship with someone who physically or mentally threatens you? N   Is it safe for you to go home? Y         Alcohol Risk Factor Screening:  On any occasion during the past 3 months, have you had more than 3 drinks containing alcohol?  No  Do you average more than 7 drinks per week?  No    Hearing Loss: Hearing is good.    Cognition Screen:  Has your family/caregiver stated any concerns about your memory: no  appropriate for age attention/concentration  and appropriate safety awareness.  MMSE is 28/30    Activities of Daily Living:    Requires assistance with: no ADLs  Home contains: no safety equipment.  Exercise: not active    Adult Nutrition Screen:  No risk factors noted.       Health Maintenance  Daily Aspirin: yes  Bone Density: not UTD - order placed  Glaucoma Screening: UTD    Immunizations:    Influenza: up to date.  Tetanus: up to date.  Shingles: up to date - reviewed Shingrix vaccine & will readdress at next visit.  Pneumonia: up to date.     Cancer screening:    Cervical: reviewed guidelines, n/a.  Breast: reviewed guidelines, reviewed SBE with her, UTD.  Colon: guidelines reviewed, UTD.       Patient Care Team:  Bud Face, MD as PCP - General (Internal Medicine)  Joseph Berkshire, MD (Gastroenterology)  Gaspar Skeeters, MD (Ophthalmology)   Resa Miner, RN as Ambulatory Care Navigator (Internal Medicine)       The following sections were reviewed & updated as appropriate: PMH, PSH, FH, and SH.      Patient Active Problem List   Diagnosis Code   ??? Hypothyroid E03.9   ??? Hyperlipidemia E78.5   ??? Obesity (BMI 30.0-34.9) E66.9   ??? 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 TGA (transient global amnesia) G45.4          Prior to Admission medications    Medication Sig Start Date End Date Taking? Authorizing Provider   SYNTHROID 100 mcg tablet TAKE 1 TABLET BY MOUTH EVERY DAY 11/26/15  Yes Bud Face, MD   naproxen sodium (ALEVE) 220 mg tablet Take 440 mg by mouth two (2) times daily (with meals).   Yes Historical Provider   aspirin delayed-release 81 mg tablet Take 81 mg by mouth daily.   Yes Historical Provider   omeprazole (PRILOSEC) 40 mg capsule Take 1 Cap by mouth daily. 08/09/16   Bud Face, MD   atorvastatin (LIPITOR) 10 mg tablet TAKE 1 TABLET BY MOUTH DAILY 11/26/15   Bud Face, MD          Allergies   Allergen Reactions   ??? Latex, Natural Rubber Itching              Review of Systems   Constitutional: Negative for chills, fever, malaise/fatigue and weight loss.   HENT: Positive for congestion. Negative for ear pain, sinus pain and sore throat.         URI x 3 days, mostly sinus congestion, not using any OTC meds   Respiratory: Negative for cough and shortness of breath.    Cardiovascular: Negative for chest pain, palpitations and leg swelling.   Gastrointestinal: Negative for abdominal pain, constipation, diarrhea, heartburn, nausea and vomiting.   Genitourinary: Negative for frequency.   Musculoskeletal: Negative for joint pain and myalgias.   Skin: Negative for rash.   Neurological: Negative for tingling, sensory change, focal weakness and headaches.   Psychiatric/Behavioral: Negative for depression. The patient is not nervous/anxious and does not have insomnia.          Objective:   Physical Exam    Constitutional: She appears well-developed. No distress.   obese   HENT:   Right Ear: Tympanic membrane is not erythematous and not bulging. No middle ear effusion.   Left Ear: Tympanic membrane is not erythematous and not bulging.  No middle ear  effusion.   Nose: No mucosal edema or rhinorrhea. Right sinus exhibits no maxillary sinus tenderness and no frontal sinus tenderness. Left sinus exhibits no maxillary sinus tenderness and no frontal sinus tenderness.   Mouth/Throat: Uvula is midline and mucous membranes are normal. No oropharyngeal exudate or posterior oropharyngeal erythema.   Eyes: Conjunctivae and lids are normal. No scleral icterus.   Neck: Neck supple. No thyromegaly present.   Cardiovascular: Regular rhythm and normal heart sounds.    No murmur heard.  Pulmonary/Chest: Effort normal and breath sounds normal. She has no wheezes. She has no rales.   Abdominal: Soft. Bowel sounds are normal. She exhibits no mass. There is no hepatosplenomegaly. There is no tenderness.   Musculoskeletal: She exhibits no edema.   Lymphadenopathy:     She has no cervical adenopathy.   Skin: No rash noted.   Psychiatric: She has a normal mood and affect. Her behavior is normal.          Visit Vitals   ??? BP 110/76   ??? Pulse 70   ??? Temp 98.3 ??F (36.8 ??C) (Oral)   ??? Resp 16   ??? Ht 5' 5.75" (1.67 m)   ??? Wt 185 lb (83.9 kg)   ??? SpO2 96%   ??? BMI 30.09 kg/m2          Advised her to call back or return to office if symptoms worsen/change/persist.  Discussed expected course/resolution/complications of diagnosis in detail with patient.    Medication risks/benefits/costs/interactions/alternatives discussed with patient.  She was given an after visit summary which includes diagnoses, current medications, & vitals.  She expressed understanding with the diagnosis and plan.      Aspects of this note may have been generated using voice recognition software. Despite editing, there may be some syntax errors.       Bud Face, MD

## 2016-12-15 NOTE — Addendum Note (Signed)
Addended by: Eloisa NorthernKRANITZKY, Oswell Say L on: 12/15/2016 07:56 AM      Modules accepted: Orders

## 2016-12-22 ENCOUNTER — Inpatient Hospital Stay: Payer: MEDICARE | Attending: Internal Medicine

## 2016-12-22 ENCOUNTER — Inpatient Hospital Stay: Admit: 2016-12-22 | Payer: MEDICARE | Attending: Internal Medicine

## 2016-12-22 DIAGNOSIS — Z13820 Encounter for screening for osteoporosis: Secondary | ICD-10-CM

## 2016-12-22 DIAGNOSIS — Z1231 Encounter for screening mammogram for malignant neoplasm of breast: Secondary | ICD-10-CM

## 2017-02-03 ENCOUNTER — Inpatient Hospital Stay: Admit: 2017-02-03 | Payer: MEDICARE | Attending: Internal Medicine

## 2017-02-03 ENCOUNTER — Ambulatory Visit: Payer: MEDICARE

## 2017-02-03 DIAGNOSIS — Z1231 Encounter for screening mammogram for malignant neoplasm of breast: Secondary | ICD-10-CM

## 2017-05-10 ENCOUNTER — Encounter

## 2017-06-21 ENCOUNTER — Ambulatory Visit: Admit: 2017-06-21 | Discharge: 2017-06-21 | Payer: MEDICARE | Attending: Internal Medicine

## 2017-06-21 ENCOUNTER — Inpatient Hospital Stay: Admit: 2017-09-26 | Payer: MEDICARE

## 2017-06-21 DIAGNOSIS — E034 Atrophy of thyroid (acquired): Secondary | ICD-10-CM

## 2017-06-21 MED ORDER — VARICELLA-ZOSTER GLYCOE VACC-AS01B ADJ(PF) 50 MCG/0.5 ML IM SUSPENSION
50 mcg/0.5 mL | INTRAMUSCULAR | 1 refills | Status: DC
Start: 2017-06-21 — End: 2017-11-15

## 2017-06-21 NOTE — Patient Instructions (Signed)
Osteoporosis: Care Instructions  Your Care Instructions    Osteoporosis causes bones to become thin and weak. It is much more common in women than in men. Osteoporosis may be very advanced before you know you have it. Sometimes the first sign is a broken bone in the hip, spine, or wrist or sudden pain in your middle or lower back.  Follow-up care is a key part of your treatment and safety. Be sure to make and go to all appointments, and call your doctor if you are having problems. It's also a good idea to know your test results and keep a list of the medicines you take.  How can you care for yourself at home?  ?? Your doctor may prescribe a bisphosphonate, such as risedronate (Actonel) or alendronate (Fosamax), for osteoporosis. If you are taking one of these medicines by mouth:  ?? Take your medicine with a full glass of water when you first get up in the morning.  ?? Do not lie down, eat, drink a beverage, or take any other medicine for at least 30 minutes after taking the drug. This helps prevent stomach problems.  ?? Do not take your medicine late in the day if you forgot to take it in the morning. Skip it, and take the usual dose the next morning.  ?? If you have side effects, tell your doctor. He or she may prescribe another medicine.  ?? Get enough calcium and vitamin D. The Institute of Medicine recommends adults younger than age 51 need 1,000 mg of calcium and 600 IU of vitamin D each day. Women ages 51 to 70 need 1,200 mg of calcium and 600 IU of vitamin D each day. Men ages 51 to 70 need 1,000 mg of calcium and 600 IU of vitamin D each day. Adults 71 and older need 1,200 mg of calcium and 800 IU of vitamin D each day.  ?? Eat foods rich in calcium, like yogurt, cheese, milk, and dark green vegetables. This is a good way to get the calcium you need. You can get vitamin D from eggs, fatty fish, cereal, and milk.  ?? Talk to your doctor about taking a calcium plus vitamin D supplement. Be  careful, though. Adults ages 19 to 50 should not get more than 2,500 mg of calcium and 4,000 IU of vitamin D each day, whether it is from supplements and/or food. Adults ages 51 and older should not get more than 2,000 mg of calcium and 4,000 IU of vitamin D each day from supplements and/or food.  ?? Limit alcohol to 2 drinks a day for men and 1 drink a day for women. Too much alcohol can cause health problems.  ?? Do not smoke. Smoking puts you at a much higher risk for osteoporosis. If you need help quitting, talk to your doctor about stop-smoking programs and medicines. These can increase your chances of quitting for good.  ?? Get regular bone-building exercise. Weight-bearing and resistance exercises keep bones healthy by working the muscles and bones against gravity. Start out at an exercise level that feels right for you. Add a little at a time until you can do the following:  ?? Do 30 minutes of weight-bearing exercise on most days of the week. Walking, jogging, stair climbing, and dancing are good choices.  ?? Do resistance exercises with weights or elastic bands 2 to 3 days a week.  ?? Reduce your risk of falls:  ?? Wear supportive shoes with low heels and nonslip   soles.  ?? Use a cane or walker, if you need it. Use shower chairs and bath benches. Put in handrails on stairways, around your shower or tub area, and near the toilet.  ?? Keep stairs, porches, and walkways well lit. Use night-lights.  ?? Remove throw rugs and other objects that are in the way.  ?? Avoid icy, wet, or slippery surfaces.  ?? Keep a cordless phone and a flashlight with new batteries by your bed.  When should you call for help?  Watch closely for changes in your health, and be sure to contact your doctor if you have any problems.  Where can you learn more?  Go to http://www.healthwise.net/[employer].  Enter K100 in the search box to learn more about "Osteoporosis: Care Instructions."  Current as of: Apr 23, 2016  Content Version: 11.4   ?? 2006-2017 [employer]. Care instructions adapted under license by [employer] (which disclaims liability or warranty for this information). If you have questions about a medical condition or this instruction, always ask your healthcare professional. [employer] disclaims any warranty or liability for your use of this information.

## 2017-06-21 NOTE — Progress Notes (Signed)
Follow up.

## 2017-06-21 NOTE — Progress Notes (Signed)
Jaime Bailey is a 76 y.o. female who was seen in clinic today (06/21/2017).        Assessment & Plan:   Diagnoses and all orders for this visit:    1. Hypothyroidism due to acquired atrophy of thyroid- well controlled, continue current treatment pending review of labs   -     TSH 3RD GENERATION    2. Pure hypercholesterolemia- at goal, continue current treatment pending review of labs   -     METABOLIC PANEL, COMPREHENSIVE  -     LIPID PANEL    3. Age-related osteoporosis without current pathological fracture- new dx, reviewed DEXA, recommended treatment (injections or PO), reviewed hip/back/wrist, she declined medications    4. Overweight (BMI 25.0-29.9)- improved, congratulated, I have reviewed/discussed the above normal BMI with the patient.  I have recommended the following interventions: encourage exercise and lifestyle education regarding diet.     5. Swelling of right hand- this is a new problem, symptoms are: not changed, differential dx reviewed with the patient, favor more of a tendon issue then an OA issue.  Will treat with: brace, ice, rest.  Red flags were reviewed with the patient to RTC or notify me, expected time course for resolution reviewed.  Will need imaging or to see hand specialist if continues.    6. Encounter for immunization  -     varicella-zoster recombinant, PF, (SHINGRIX) 50 mcg/0.5 mL susr injection; 0.5 mL IM once now and then repeat in 2-6 months         Follow-up Disposition:  Return in about 1 year (around 06/21/2018) for FULL PHYSICAL - 30 minutes.      Subjective:   Jaime Bailey was seen today for Hypothyroidism; Cholesterol Problem; and Osteoporosis    Endocrine Review  Patient is seen for followup of hypothyroidism.  Since last visit: no changes.  She reports medication compliance: all the time and is taking separate from all her other meds.  She reports the following concerns/problems/med side effects: none.  Lab review: labs reviewed and discussed with patient.        She is seen for osteoporosis.  Since last visit: this is a new diagnosis.  DEXA completed on 12/1016.  She is on the following treatment: none.  She denies any falls or joint pain.      Cardiovascular Review  The patient has hyperlipidemia and overweight.  Since last visit: weight has decreased.  She reports taking medications as instructed, no medication side effects noted.  Diet and Lifestyle: generally follows a low fat low cholesterol diet, exercises regularly.  Labs: reviewed and discussed with patient.          Brief Labs:     Lab Results   Component Value Date/Time    Creatinine, External 0.6 10/05/2016    LDL-C, External 97 10/05/2016    Hemoglobin A1c, External 5.3 10/05/2016          Prior to Admission medications    Medication Sig Start Date End Date Taking? Authorizing Provider   SYNTHROID 100 mcg tablet TAKE 1 TABLET BY MOUTH EVERY DAY 12/15/16  Yes Bud Facehristopher P Aaliyan Brinkmeier, MD   atorvastatin (LIPITOR) 10 mg tablet TAKE 1 TABLET BY MOUTH DAILY 12/15/16  Yes Bud Facehristopher P Siddhant Hashemi, MD   naproxen sodium (ALEVE) 220 mg tablet Take 440 mg by mouth two (2) times daily (with meals).   Yes Historical Provider   aspirin delayed-release 81 mg tablet Take 81 mg by mouth daily.   Yes Historical  Provider          Allergies   Allergen Reactions   ??? Latex, Natural Rubber Itching           Review of Systems   Constitutional: Negative for malaise/fatigue and weight loss.   Respiratory: Negative for cough and shortness of breath.    Cardiovascular: Negative for chest pain, palpitations and leg swelling.   Gastrointestinal: Negative for abdominal pain, constipation, diarrhea, heartburn, nausea and vomiting.   Genitourinary: Negative for frequency.   Musculoskeletal: Positive for joint pain. Negative for myalgias.        R hand, present for 6 months, on/off, not getting better, no obvious triggers, has not tried anything   Skin: Negative for rash.   Neurological: Negative for tingling, sensory change, focal weakness and headaches.    Psychiatric/Behavioral: Negative for depression. The patient is not nervous/anxious and does not have insomnia.            Objective:   Physical Exam   Constitutional: She appears well-developed. No distress.   HENT:   Mouth/Throat: Mucous membranes are normal.   Eyes: Conjunctivae and lids are normal. No scleral icterus.   Neck: Neck supple. No thyromegaly present.   Cardiovascular: Regular rhythm and normal heart sounds.    No murmur heard.  Pulmonary/Chest: Effort normal and breath sounds normal. She has no wheezes. She has no rales.   Abdominal: Soft. Bowel sounds are normal. She exhibits no mass. There is no hepatosplenomegaly. There is no tenderness.   Musculoskeletal: She exhibits no edema.        Right hand: She exhibits deformity. She exhibits normal range of motion and no tenderness. Normal strength noted.        Hands:  Lymphadenopathy:     She has no cervical adenopathy.   Skin: No rash noted.   Psychiatric: She has a normal mood and affect. Her behavior is normal.         Visit Vitals   ??? BP 110/84   ??? Pulse 64   ??? Temp 97.9 ??F (36.6 ??C) (Oral)   ??? Resp 14   ??? Ht 5' 5.75" (1.67 m)   ??? Wt 178 lb (80.7 kg)   ??? SpO2 97%   ??? BMI 28.95 kg/m2         Disclaimer:  Advised her to call back or return to office if symptoms worsen/change/persist.  Discussed expected course/resolution/complications of diagnosis in detail with patient.    Medication risks/benefits/costs/interactions/alternatives discussed with patient.  She was given an after visit summary which includes diagnoses, current medications, & vitals.  She expressed understanding with the diagnosis and plan.      Aspects of this note may have been generated using voice recognition software. Despite editing, there may be some syntax errors.       Bud Face, MD

## 2017-06-22 LAB — METABOLIC PANEL, COMPREHENSIVE
A-G Ratio: 1.9 (ref 1.2–2.2)
ALT (SGPT): 15 IU/L (ref 0–32)
AST (SGOT): 21 IU/L (ref 0–40)
Albumin: 4.4 g/dL (ref 3.5–4.8)
Alk. phosphatase: 87 IU/L (ref 39–117)
BUN/Creatinine ratio: 25 (ref 12–28)
BUN: 15 mg/dL (ref 8–27)
Bilirubin, total: 0.3 mg/dL (ref 0.0–1.2)
CO2: 19 mmol/L — ABNORMAL LOW (ref 20–29)
Calcium: 10.3 mg/dL (ref 8.7–10.3)
Chloride: 106 mmol/L (ref 96–106)
Creatinine: 0.61 mg/dL (ref 0.57–1.00)
GFR est AA: 103 mL/min/{1.73_m2} (ref >59)
GFR est non-AA: 89 mL/min/{1.73_m2} (ref >59)
GLOBULIN, TOTAL: 2.3 g/dL (ref 1.5–4.5)
Glucose: 95 mg/dL (ref 65–99)
Potassium: 4.7 mmol/L (ref 3.5–5.2)
Protein, total: 6.7 g/dL (ref 6.0–8.5)
Sodium: 143 mmol/L (ref 134–144)

## 2017-06-22 LAB — LIPID PANEL
Cholesterol, total: 191 mg/dL (ref 100–199)
HDL Cholesterol: 47 mg/dL (ref >39)
LDL, calculated: 108 mg/dL — ABNORMAL HIGH (ref 0–99)
Triglyceride: 182 mg/dL — ABNORMAL HIGH (ref 0–149)
VLDL, calculated: 36 mg/dL (ref 5–40)

## 2017-06-22 LAB — TSH 3RD GENERATION: TSH: 1.69 u[IU]/mL (ref 0.450–4.500)

## 2017-06-22 NOTE — Progress Notes (Signed)
Results released to patient via MyChart.  All labs are stable or at goal for her.

## 2017-11-15 ENCOUNTER — Ambulatory Visit: Admit: 2017-11-15 | Discharge: 2017-11-15 | Payer: MEDICARE | Attending: Internal Medicine

## 2017-11-15 DIAGNOSIS — J0191 Acute recurrent sinusitis, unspecified: Secondary | ICD-10-CM

## 2017-11-15 MED ORDER — AMOXICILLIN 875 MG TAB
875 mg | ORAL_TABLET | Freq: Two times a day (BID) | ORAL | 0 refills | Status: AC
Start: 2017-11-15 — End: 2017-11-22

## 2017-11-15 NOTE — Progress Notes (Signed)
Jaime Bailey is a 76 y.o. female who was seen in clinic today (11/15/2017).        Assessment & Plan:   Diagnoses and all orders for this visit:    1. Acute recurrent sinusitis, unspecified location- symptoms: show no change, discussed differential diagnosis and at this time favor a bacterial etiology.  Reviewed sounds like post nasal drip as no signs of strep.  Discussed diagnosis & treatment options.  Will start meds below, red flags were reviewed with the patient to RTC or notify me.  Expected time course for resolution reviewed with patient.   -     amoxicillin (AMOXIL) 875 mg tablet; Take 1 Tab by mouth two (2) times a day for 7 days.         Follow-up Disposition:  Return if symptoms worsen or fail to improve.      Subjective:   Jaime Bailey was seen today for Sore Throat    URI Review  Jaime Bailey returns to clinic today to talk about: sore throat for 3 weeks ago, which are unchanged since that time.  Was severe for the 1st week but over the last 2 weeks has been constant.  She reports sore throat, dry cough, headache and SOB/DOE.  She denies a history of: fever, chills, rhinorrhea, sinus congestion, chest congestion and SOB/DOE.  Treatments have included: Tylenol which have been temporarily effective.  Relevant PMH: No pertinent additional PMH.  Patient reports sick contacts: yes - grandchild w/ a cold.         Prior to Admission medications    Medication Sig Start Date End Date Taking? Authorizing Provider   SYNTHROID 100 mcg tablet TAKE 1 TABLET BY MOUTH EVERY DAY 12/15/16  Yes Bud FaceHayes, Zaylee Cornia P, MD   atorvastatin (LIPITOR) 10 mg tablet TAKE 1 TABLET BY MOUTH DAILY 12/15/16  Yes Bud FaceHayes, Kayzlee Wirtanen P, MD   naproxen sodium (ALEVE) 220 mg tablet Take 440 mg by mouth two (2) times daily (with meals).   Yes Provider, Historical   aspirin delayed-release 81 mg tablet Take 81 mg by mouth daily.   Yes Provider, Historical          Allergies   Allergen Reactions   ??? Latex, Natural Rubber Itching           ROS - per HPI         Objective:   Physical Exam   Constitutional: No distress.   HENT:   Right Ear: Tympanic membrane is not erythematous and not bulging. No middle ear effusion.   Left Ear: Tympanic membrane is not erythematous and not bulging.  No middle ear effusion.   Nose: No mucosal edema or rhinorrhea. Right sinus exhibits no maxillary sinus tenderness and no frontal sinus tenderness. Left sinus exhibits no maxillary sinus tenderness and no frontal sinus tenderness.   Mouth/Throat: Uvula is midline and mucous membranes are normal. Posterior oropharyngeal erythema present. No oropharyngeal exudate.   Eyes: Conjunctivae are normal. No scleral icterus.   Neck: Neck supple.   Cardiovascular: Regular rhythm and normal heart sounds.   No murmur heard.  Pulmonary/Chest: Effort normal and breath sounds normal. She has no wheezes. She has no rales.   Lymphadenopathy:     She has no cervical adenopathy.         Visit Vitals  BP 110/76   Pulse 68   Temp 98.2 ??F (36.8 ??C) (Oral)   Resp 16   Ht 5' 5.75" (1.67 m)   Wt 181 lb (82.1 kg)  SpO2 97%   BMI 29.44 kg/m??         Disclaimer:  Advised her to call back or return to office if symptoms worsen/change/persist.  Discussed expected course/resolution/complications of diagnosis in detail with patient.    Medication risks/benefits/costs/interactions/alternatives discussed with patient.  She was given an after visit summary which includes diagnoses, current medications, & vitals.  She expressed understanding with the diagnosis and plan.      Aspects of this note may have been generated using voice recognition software. Despite editing, there may be some syntax errors.       Bud Facehristopher P Shaquayla Klimas, MD

## 2017-11-15 NOTE — Patient Instructions (Signed)
Sinusitis: Care Instructions  Your Care Instructions    Sinusitis is an infection of the lining of the sinus cavities in your head. Sinusitis often follows a cold. It causes pain and pressure in your head and face.  In most cases, sinusitis gets better on its own in 1 to 2 weeks. But some mild symptoms may last for several weeks. Sometimes antibiotics are needed.  Follow-up care is a key part of your treatment and safety. Be sure to make and go to all appointments, and call your doctor if you are having problems. It's also a good idea to know your test results and keep a list of the medicines you take.  How can you care for yourself at home?  ?? Take an over-the-counter pain medicine, such as acetaminophen (Tylenol), ibuprofen (Advil, Motrin), or naproxen (Aleve). Read and follow all instructions on the label.  ?? If the doctor prescribed antibiotics, take them as directed. Do not stop taking them just because you feel better. You need to take the full course of antibiotics.  ?? Be careful when taking over-the-counter cold or flu medicines and Tylenol at the same time. Many of these medicines have acetaminophen, which is Tylenol. Read the labels to make sure that you are not taking more than the recommended dose. Too much acetaminophen (Tylenol) can be harmful.  ?? Breathe warm, moist air from a steamy shower, a hot bath, or a sink filled with hot water. Avoid cold, dry air. Using a humidifier in your home may help. Follow the directions for cleaning the machine.  ?? Use saline (saltwater) nasal washes to help keep your nasal passages open and wash out mucus and bacteria. You can buy saline nose drops at a grocery store or drugstore. Or you can make your own at home by adding 1 teaspoon of salt and 1 teaspoon of baking soda to 2 cups of distilled water. If you make your own, fill a bulb syringe with the solution, insert the tip into your nostril, and squeeze gently. Blow your nose.   ?? Put a hot, wet towel or a warm gel pack on your face 3 or 4 times a day for 5 to 10 minutes each time.  ?? Try a decongestant nasal spray like oxymetazoline (Afrin). Do not use it for more than 3 days in a row. Using it for more than 3 days can make your congestion worse.  When should you call for help?  Call your doctor now or seek immediate medical care if:  ?? ?? You have new or worse swelling or redness in your face or around your eyes.   ?? ?? You have a new or higher fever.   ??Watch closely for changes in your health, and be sure to contact your doctor if:  ?? ?? You have new or worse facial pain.   ?? ?? The mucus from your nose becomes thicker (like pus) or has new blood in it.   ?? ?? You are not getting better as expected.   Where can you learn more?  Go to http://www.healthwise.net/[employer].  Enter I933 in the search box to learn more about "Sinusitis: Care Instructions."  Current as of: March 09, 2017  Content Version: 11.8  ?? 2006-2018 [employer]. Care instructions adapted under license by [employer] (which disclaims liability or warranty for this information). If you have questions about a medical condition or this instruction, always ask your healthcare professional. [employer] disclaims any warranty or liability for your   use of this information.

## 2017-11-15 NOTE — Progress Notes (Signed)
Has not been feeling well for 3 weeks, started with sore throat, got better, throat sore again. Cough, no discharge.

## 2018-02-11 ENCOUNTER — Encounter

## 2018-02-13 MED ORDER — ATORVASTATIN 10 MG TAB
10 mg | ORAL_TABLET | ORAL | 0 refills | Status: DC
Start: 2018-02-13 — End: 2018-05-13

## 2018-02-16 ENCOUNTER — Encounter

## 2018-02-17 MED ORDER — SYNTHROID 100 MCG TABLET
100 mcg | ORAL_TABLET | ORAL | 0 refills | Status: DC
Start: 2018-02-17 — End: 2018-05-13

## 2018-04-24 ENCOUNTER — Encounter

## 2018-05-13 ENCOUNTER — Encounter

## 2018-05-15 ENCOUNTER — Inpatient Hospital Stay: Admit: 2018-05-15 | Payer: MEDICARE | Attending: Internal Medicine

## 2018-05-15 DIAGNOSIS — Z1231 Encounter for screening mammogram for malignant neoplasm of breast: Secondary | ICD-10-CM

## 2018-05-15 MED ORDER — SYNTHROID 100 MCG TABLET
100 mcg | ORAL_TABLET | ORAL | 0 refills | Status: DC
Start: 2018-05-15 — End: 2018-08-10

## 2018-05-15 MED ORDER — ATORVASTATIN 10 MG TAB
10 mg | ORAL_TABLET | ORAL | 0 refills | Status: DC
Start: 2018-05-15 — End: 2018-08-10

## 2018-05-15 NOTE — Progress Notes (Signed)
Results reviewed.

## 2018-05-24 ENCOUNTER — Ambulatory Visit: Admit: 2018-05-24 | Discharge: 2018-05-24 | Payer: MEDICARE | Attending: Internal Medicine

## 2018-05-24 ENCOUNTER — Inpatient Hospital Stay: Admit: 2018-08-04 | Payer: MEDICARE

## 2018-05-24 DIAGNOSIS — Z0000 Encounter for general adult medical examination without abnormal findings: Secondary | ICD-10-CM

## 2018-05-24 DIAGNOSIS — E7800 Pure hypercholesterolemia, unspecified: Secondary | ICD-10-CM

## 2018-05-24 NOTE — Progress Notes (Signed)
Annual wellness.  Wants to discuss results of AAA ultrasound. Will provide copy of AMD.

## 2018-05-24 NOTE — ACP (Advance Care Planning) (Signed)
Advance Care Planning (ACP) Provider Note - Comprehensive     Date of ACP Conversation: 05/24/18  Persons included in Conversation:  patient  Length of ACP Conversation in minutes: <16 minutes (Non-Billable)    Authorized Management consultantDecision Maker (if patient is incapable of making informed decisions):   Healthcare Agent/Medical Power of Attorney under Advance Directive      She has an advanced directive - a copy HAS NOT been provided.  Reviewed DNR/DNI and patient is not interested.         General ACP for ALL Patients with Decision Making Capacity:  Importance of advance care planning, including choosing a healthcare agent to communicate patient's healthcare decisions if patient lost the ability to make decisions, such as after a sudden illness or accident  Understanding of the healthcare agent role was assessed and information provided  Opportunity offered to explore how cultural, religious, spiritual, or personal beliefs would affect decisions for future care  Exploration of values, goals, and preferences if recovery is not expected, even with continued medical treatment in the event of: Imminent death or severe, permanent brain injury    For Serious or Chronic Illness:  Understanding of CPR, goals and expected outcomes, benefits and burdens discussed.  Understanding of medical condition  Information on CPR success rates provided (e.g. for CPR in hospital, survival to d/c at two weeks is 22%, for chronically ill or elderly/frail survival is less than 3%)    Interventions Provided:  Recommended communicating the plan and making copies for the healthcare agent, personal physician, and others as appropriate (e.g., health system)  Recommended review of completed ACP document annually or upon change in health status

## 2018-05-24 NOTE — Progress Notes (Signed)
Results released to patient via MyChart.  All labs are stable or at goal for her.

## 2018-05-24 NOTE — Progress Notes (Signed)
Spoke with someone at the front desk. She said they dont normally add on tests but she will send a message to the doctor.

## 2018-05-24 NOTE — Progress Notes (Signed)
Jaime Bailey is a 77 y.o. female who was seen in clinic today (05/24/2018) for a full physical.           Assessment & Plan:   Diagnoses and all orders for this visit:    1. Medicare annual wellness visit, subsequent    2. Advanced care planning/counseling discussion    3. Encounter for immunization    4. Screening for alcoholism  -     PR ANNUAL ALCOHOL SCREEN 15 MIN    5. Screening for depression  -     DEPRESSION SCREEN ANNUAL    6. Hypothyroidism due to acquired atrophy of thyroid- well controlled, continue current treatment pending review of labs   -     TSH 3RD GENERATION    7. Pure hypercholesterolemia- at goal, continue current treatment pending review of labs   -     METABOLIC PANEL, COMPREHENSIVE  -     LIPID PANEL    8. Age-related osteoporosis without current pathological fracture- stable, declined medications    9. 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 Obesity (BMI 30.0-34.9)- stable, poorly controlled, needs improvement, I have recommended the following interventions: encourage exercise and lifestyle education regarding diet.     11. Abdominal discomfort, epigastric- this is a chronic problem but new to me, symptoms are: intermittent, differential dx reviewed with the patient, and not sure if this is stricture related or gastroparesis related.  Does not sound like GERD.  She has c-scope set up for next week will d/w GI about doing EGD also.     -     METABOLIC PANEL, COMPREHENSIVE  -     CBC W/O DIFF         Follow-up and Dispositions    ?? Return in about 1 year (around 05/25/2019), or if symptoms worsen or fail to improve, for FULL PHYSICAL - 30 minutes.            ------------------------------------------------------------------------------------------    Subjective:   Annual Wellness Visit- Subsequent Visit    End of Life Planning: This was discussed with her today and she has an advanced directive - a copy HAS NOT been provided.  Reviewed DNR/DNI and patient is not  interested.      Depression Screen:  3 most recent PHQ Screens 05/24/2018   Little interest or pleasure in doing things Not at all   Feeling down, depressed, irritable, or hopeless Not at all   Total Score PHQ 2 0         Fall Risk:   Fall Risk Assessment, last 12 mths 05/24/2018   Able to walk? Yes   Fall in past 12 months? No   Fall with injury? -   Number of falls in past 12 months -   Fall Risk Score -       Abuse Screen:  Abuse Screening Questionnaire 05/24/2018   Do you ever feel afraid of your partner? N   Are you in a relationship with someone who physically or mentally threatens you? N   Is it safe for you to go home? Y         Alcohol Risk Factor Screening:  On any occasion during the past 3 months, have you had more than 3 drinks containing alcohol?  No  Do you average more than 7 drinks per week?  No    Hearing Loss: Hearing needs further evaluation, she reports some changes    Cognition Screen:  Has your family/caregiver stated any concerns about your memory: no  Cognition: appears appropriate for age attention/concentration and appears appropriate safety awareness    Activities of Daily Living:    Requires assistance with: no ADLs  Home contains: no safety equipment.  Exercise: moderately active    Adult Nutrition Screen:  No risk factors noted.       Health Maintenance  Daily Aspirin: reviewed, due to TGA she wants to continue  Bone Density: UTD - done 12/22/16- osteoporosis   Glaucoma Screening: UTD    Immunizations:   Influenza: up to date  Tetanus: up to date  Shingles: not up to date - she is on waiting list  Pneumonia: up to date  Cancer screening:   Cervical: reviewed guidelines, n/a  Breast: reviewed guidelines, up to date  Colon: reviewed guidelines, up to date, she has scheduled next week       Patient Care Team:  Bud FaceHayes, Lucan Riner P, MD as PCP - General (Internal Medicine)  Joseph BerkshireSandhu, Bimaljit S, MD (Gastroenterology)  Gaspar Skeetersobinson, Grover C IV, MD (Ophthalmology)       In addition to the above issues  we also reviewed the following acute and/or chronic problems:    Endocrine Review  Patient is seen for followup of hypothyroidism.  Since last visit: no changes.  She reports medication compliance: all the time and is taking separate from all other meds.  She reports the following concerns/problems/med side effects: none.  Lab review: labs reviewed and discussed with patient.      Cardiovascular Review  The patient has hyperlipidemia and obesity.  Since last visit: no changes.  She reports taking medications as instructed, no medication side effects noted.  Diet and Lifestyle: generally follows a low fat low cholesterol diet, exercises sporadically.  Labs: reviewed and discussed with patient.      The following sections were reviewed & updated as appropriate: PMH, PSH, FH, and SH.      Patient Active Problem List   Diagnosis Code   ??? Hypothyroid E03.9   ??? Hyperlipidemia E78.5   ??? Obesity (BMI 30.0-34.9) E66.9   ??? 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 Age-related osteoporosis without current pathological fracture M81.0          Prior to Admission medications    Medication Sig Start Date End Date Taking? Authorizing Provider   SYNTHROID 100 mcg tablet TAKE 1 TABLET BY MOUTH EVERY DAY 05/14/18  Yes Bud FaceHayes, Kimesha Claxton P, MD   atorvastatin (LIPITOR) 10 mg tablet TAKE 1 TABLET BY MOUTH DAILY 05/14/18  Yes Bud FaceHayes, Davy Faught P, MD   naproxen sodium (ALEVE) 220 mg tablet Take 440 mg by mouth two (2) times daily (with meals).   Yes Provider, Historical   aspirin delayed-release 81 mg tablet Take 81 mg by mouth daily.   Yes Provider, Historical          Allergies   Allergen Reactions   ??? Latex, Natural Rubber Itching              Review of Systems   Constitutional: Negative for malaise/fatigue and weight loss.   Respiratory: Negative for cough and shortness of breath.    Cardiovascular: Negative for chest pain, palpitations and leg swelling.   Gastrointestinal: Positive for  heartburn (she reprots using tums 1/week, she reports having sensation of food getting hung up just above her stomach, no choking, this is long standing). Negative for abdominal pain, constipation,  diarrhea, nausea and vomiting.   Genitourinary: Negative for frequency.   Musculoskeletal: Negative for joint pain and myalgias.   Skin: Negative for rash.   Neurological: Negative for tingling, sensory change, focal weakness and headaches.   Psychiatric/Behavioral: Negative for depression. The patient is not nervous/anxious and does not have insomnia.          Objective:   Physical Exam   Constitutional: She appears well-developed. No distress.        obese   HENT:   Mouth/Throat: Mucous membranes are normal.   Eyes: Conjunctivae and lids are normal. No scleral icterus.   Neck: Neck supple. No thyromegaly present.   Cardiovascular: Regular rhythm and normal heart sounds.   No murmur heard.  Pulmonary/Chest: Effort normal and breath sounds normal. She has no wheezes. She has no rales. She exhibits no tenderness.   Abdominal: Soft. Bowel sounds are normal. She exhibits no mass. There is no hepatosplenomegaly. There is no tenderness.   Musculoskeletal: She exhibits no edema.   Lymphadenopathy:     She has no cervical adenopathy.   Skin: No rash noted.   Psychiatric: She has a normal mood and affect. Her behavior is normal.          Visit Vitals  BP 122/88   Pulse 60   Temp 98.4 ??F (36.9 ??C) (Oral)   Resp 18   Ht 5' 5.5" (1.664 m)   Wt 184 lb (83.5 kg)   SpO2 96%   BMI 30.15 kg/m??          Advised her to call back or return to office if symptoms worsen/change/persist.  Discussed expected course/resolution/complications of diagnosis in detail with patient.    Medication risks/benefits/costs/interactions/alternatives discussed with patient.  She was given an after visit summary which includes diagnoses, current medications, & vitals.  She expressed understanding with the diagnosis and plan.      Aspects of this note may have  been generated using voice recognition software. Despite editing, there may be some syntax errors.       Bud Face, MD

## 2018-05-24 NOTE — Progress Notes (Signed)
Jaime Bailey is a 77 y.o. female who was seen in clinic today (05/24/2018) for a full physical.           Assessment & Plan:   Diagnoses and all orders for this visit:    1. Medicare annual wellness visit, subsequent    2. Advanced care planning/counseling discussion    3. Encounter for immunization    4. Screening for alcoholism  -     PR ANNUAL ALCOHOL SCREEN 15 MIN    5. Screening for depression  -     DEPRESSION SCREEN ANNUAL    6. Hypothyroidism due to acquired atrophy of thyroid- well controlled, continue current treatment pending review of labs   -     TSH 3RD GENERATION    7. Pure hypercholesterolemia- at goal, continue current treatment pending review of labs   -     METABOLIC PANEL, COMPREHENSIVE  -     LIPID PANEL    8. Age-related osteoporosis without current pathological fracture- stable, declined medications    9. 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 Obesity (BMI 30.0-34.9)- stable, poorly controlled, needs improvement, I have recommended the following interventions: encourage exercise and lifestyle education regarding diet.     11. Abdominal discomfort, epigastric- this is a chronic problem but new to me, symptoms are: intermittent, differential dx reviewed with the patient, and not sure if this is stricture related or gastroparesis related.  Does not sound like GERD.  She has c-scope set up for next week will d/w GI about doing EGD also.     -     METABOLIC PANEL, COMPREHENSIVE  -     CBC W/O DIFF         Follow-up and Dispositions    ?? Return in about 1 year (around 05/25/2019), or if symptoms worsen or fail to improve, for FULL PHYSICAL - 30 minutes.            ------------------------------------------------------------------------------------------    Subjective:   Annual Wellness Visit- Subsequent Visit  End of Life Planning: This was discussed with her today and she has an advanced directive - a copy HAS NOT been provided.  Reviewed DNR/DNI and  patient is not interested.    Depression Screen:  3 most recent PHQ Screens 05/24/2018   Little interest or pleasure in doing things Not at all   Feeling down, depressed, irritable, or hopeless Not at all   Total Score PHQ 2 0         Fall Risk:   Fall Risk Assessment, last 12 mths 05/24/2018   Able to walk? Yes   Fall in past 12 months? No   Fall with injury? -   Number of falls in past 12 months -   Fall Risk Score -       Abuse Screen:  Abuse Screening Questionnaire 05/24/2018   Do you ever feel afraid of your partner? N   Are you in a relationship with someone who physically or mentally threatens you? N   Is it safe for you to go home? Y         Alcohol Risk Factor Screening:  On any occasion during the past 3 months, have you had more than 3 drinks containing alcohol?  No  Do you average more than 7 drinks per week?  No    Hearing Loss: Hearing needs further evaluation, she reports some changes    Cognition Screen:  Has your family/caregiver stated  any concerns about your memory: no  Cognition: appears appropriate for age attention/concentration and appears appropriate safety awareness    Activities of Daily Living:    Requires assistance with: no ADLs  Home contains: no safety equipment.  Exercise: moderately active    Adult Nutrition Screen:  No risk factors noted.       Health Maintenance  Daily Aspirin: reviewed, due to TGA she wants to continue  Bone Density: UTD - done 12/22/16- osteoporosis   Glaucoma Screening: UTD  Immunizations:   Influenza: up to date  Tetanus: up to date  Shingles: not up to date - she is on waiting list  Pneumonia: up to date  Cancer screening:   Cervical: reviewed guidelines, n/a  Breast: reviewed guidelines, up to date  Colon: reviewed guidelines, up to date, she has scheduled next week       Patient Care Team:  Bud FaceHayes, Laynee Lockamy P, MD as PCP - General (Internal Medicine)  Joseph BerkshireSandhu, Bimaljit S, MD (Gastroenterology)  Gaspar Skeetersobinson, Grover C IV, MD (Ophthalmology)        In addition to the above issues we also reviewed the following acute and/or chronic problems:    Endocrine Review  Patient is seen for followup of hypothyroidism.  Since last visit: no changes.  She reports medication compliance: all the time and is taking separate from all other meds.  She reports the following concerns/problems/med side effects: none.  Lab review: labs reviewed and discussed with patient.      Cardiovascular Review  The patient has hyperlipidemia and obesity.  Since last visit: no changes.  She reports taking medications as instructed, no medication side effects noted.  Diet and Lifestyle: generally follows a low fat low cholesterol diet, exercises sporadically.  Labs: reviewed and discussed with patient.      The following sections were reviewed & updated as appropriate: PMH, PSH, FH, and SH.      Patient Active Problem List   Diagnosis Code   ??? Hypothyroid E03.9   ??? Hyperlipidemia E78.5   ??? Obesity (BMI 30.0-34.9) E66.9   ??? 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 Age-related osteoporosis without current pathological fracture M81.0          Prior to Admission medications    Medication Sig Start Date End Date Taking? Authorizing Provider   SYNTHROID 100 mcg tablet TAKE 1 TABLET BY MOUTH EVERY DAY 05/14/18  Yes Bud FaceHayes, Tandrea Kommer P, MD   atorvastatin (LIPITOR) 10 mg tablet TAKE 1 TABLET BY MOUTH DAILY 05/14/18  Yes Bud FaceHayes, Amar Keenum P, MD   naproxen sodium (ALEVE) 220 mg tablet Take 440 mg by mouth two (2) times daily (with meals).   Yes Provider, Historical   aspirin delayed-release 81 mg tablet Take 81 mg by mouth daily.   Yes Provider, Historical          Allergies   Allergen Reactions   ??? Latex, Natural Rubber Itching              Review of Systems   Constitutional: Negative for malaise/fatigue and weight loss.   Respiratory: Negative for cough and shortness of breath.    Cardiovascular: Negative for chest pain, palpitations and leg swelling.    Gastrointestinal: Positive for heartburn (she reprots using tums 1/week, she reports having sensation of food getting hung up just above her stomach, no choking, this is long standing). Negative for abdominal pain, constipation, diarrhea, nausea and vomiting.  Genitourinary: Negative for frequency.   Musculoskeletal: Negative for joint pain and myalgias.   Skin: Negative for rash.   Neurological: Negative for tingling, sensory change, focal weakness and headaches.   Psychiatric/Behavioral: Negative for depression. The patient is not nervous/anxious and does not have insomnia.          Objective:   Physical Exam   Constitutional: She appears well-developed. No distress.        obese   HENT:   Mouth/Throat: Mucous membranes are normal.   Eyes: Conjunctivae and lids are normal. No scleral icterus.   Neck: Neck supple. No thyromegaly present.   Cardiovascular: Regular rhythm and normal heart sounds.   No murmur heard.  Pulmonary/Chest: Effort normal and breath sounds normal. She has no wheezes. She has no rales. She exhibits no tenderness.   Abdominal: Soft. Bowel sounds are normal. She exhibits no mass. There is no hepatosplenomegaly. There is no tenderness.   Musculoskeletal: She exhibits no edema.   Lymphadenopathy:     She has no cervical adenopathy.   Skin: No rash noted.   Psychiatric: She has a normal mood and affect. Her behavior is normal.          Visit Vitals  BP 122/88   Pulse 60   Temp 98.4 ??F (36.9 ??C) (Oral)   Resp 18   Ht 5' 5.5" (1.664 m)   Wt 184 lb (83.5 kg)   SpO2 96%   BMI 30.15 kg/m??          Advised her to call back or return to office if symptoms worsen/change/persist.  Discussed expected course/resolution/complications of diagnosis in detail with patient.    Medication risks/benefits/costs/interactions/alternatives discussed with patient.  She was given an after visit summary which includes diagnoses, current medications, & vitals.  She expressed understanding with the diagnosis and plan.       Aspects of this note may have been generated using voice recognition software. Despite editing, there may be some syntax errors.       Bud Face, MD

## 2018-05-24 NOTE — Patient Instructions (Addendum)
Hearing Evaluations:    [facility]   [facility]  [email or web address]  [phone]    [place] ([address])  [place] ([address])  [place] ([address])  [place] (off [address])      [facility] [place]   [email or web address]    [phone] --> [address], [place]  [phone] --> [address]  ([place])  [phone] --> [address]  ([place])      [facility] [place]  [email or web address]    [phone]  --> [address], [address] [place]  [place], [place] [phone]          Medicare Wellness Visit, Female     The best way to live healthy is to have a lifestyle where you eat a well-balanced diet, exercise regularly, limit alcohol use, and quit all forms of tobacco/nicotine, if applicable.   Regular preventive services are another way to keep healthy. Preventive services (vaccines, screening tests, monitoring & exams) can help personalize your care plan, which helps you manage your own care. Screening tests can find health problems at the earliest stages, when they are easiest to treat.   [facility] follows the current, evidence-based guidelines published by the [place]  Life InsurancePreventive Services Task Force (USPSTF) when recommending preventive services for our patients. Because we follow these guidelines, sometimes recommendations change over time as research supports it. (For example, mammograms used to be recommended annually. Even though Medicare will still pay for an annual mammogram, the newer guidelines recommend a mammogram every two years for women of average risk.)  Of course, you and your doctor may decide to screen more often for some diseases, based on your risk and your health status.   Preventive services for you include:  - Medicare offers their members a free annual wellness visit, which is time for you and your primary care provider to discuss and plan for your preventive service needs. Take advantage of this benefit every year!   -All adults over the age of 76 should receive the recommended pneumonia vaccines. Current USPSTF guidelines recommend a series of two vaccines for the best pneumonia protection.   -All adults should have a flu vaccine yearly and a tetanus vaccine every 10 years. All adults age 76 and older should receive a shingles vaccine once in their lifetime.    -A bone mass density test is recommended when a woman turns 65 to screen for osteoporosis. This test is only recommended one time, as a screening. Some providers will use this same test as a disease monitoring tool if you already have osteoporosis.  -All adults age 76-70 who are overweight should have a diabetes screening test once every three years.   -Other screening tests and preventive services for persons with diabetes include: an eye exam to screen for diabetic retinopathy, a kidney function test, a foot exam, and stricter control over your cholesterol.   -Cardiovascular screening for adults with routine risk involves an electrocardiogram (ECG) at intervals determined by your doctor.   -Colorectal cancer screenings should be done for adults age 76-75 with no increased risk factors for colorectal cancer.  There are a number of acceptable methods of screening for this type of cancer. Each test has its own benefits and drawbacks. Discuss with your doctor what is most appropriate for you during your annual wellness visit. The different tests include: colonoscopy (considered the best screening method), a fecal occult blood test, a fecal DNA test, and  sigmoidoscopy.  -Breast cancer screenings are recommended every other year for women of normal risk, age 16-74.  -Cervical cancer screenings for women over age 62 are only recommended with certain risk factors.   -All adults born between [birth year] and 1964 should be screened once for Hepatitis C.     Here is a list of your current Health Maintenance items (your personalized list of preventive services) with a due date:   Health Maintenance Due   Topic Date Due   ??? Shingles Vaccine (1 of 2) 03/03/1992   ??? Annual Well Visit  12/16/2017   ??? Colonoscopy  07/06/2018              Learning About Living Jaime Bailey  What is a living will?  A living will is a legal form you use to write down the kind of care you want at the end of your life. It is used by the health professionals who will treat you if you aren't able to decide for yourself.  If you put your wishes in writing, your loved ones and others will know what kind of care you want. They won't need to guess. This can ease your mind and be helpful to others.  A living will is not the same as an estate or property will. An estate will explains what you want to happen with your money and property after you die.  Is a living will a legal document?  A living will is a legal document. Each state has its own laws about living wills. If you move to another state, make sure that your living will is legal in the state where you now live. Or you might use a universal form that has been approved by many states. This kind of form can sometimes be completed and stored online. Your electronic copy will then be available wherever you have a connection to the Internet. In most cases, doctors will respect your wishes even if you have a form from a different state.  ?? You don't need an attorney to complete a living will. But legal advice can be helpful if your state's laws are unclear, your health history is complicated, or your family can't agree on what should be in your living will.  ?? You can change your living will at any time. Some people find that their wishes about end-of-life care change as their health changes.  ?? In addition to making a living will, think about completing a medical power of attorney form. This form lets you name the person you want to make end-of-life treatment decisions for you (your "health care agent") if you're not able to. Many hospitals and nursing homes will give you the  forms you need to complete a living will and a medical power of attorney.  ?? Your living will is used only if you can't make or communicate decisions for yourself anymore. If you become able to make decisions again, you can accept or refuse any treatment, no matter what you wrote in your living will.  ?? Your state may offer an online registry. This is a place where you can store your living will online so the doctors and nurses who need to treat you can find it right away.  What should you think about when creating a living will?  Talk about your end-of-life wishes with your family members and your doctor. Let them know what you want. That way the people making decisions for you won't be surprised by your choices.  Think about these questions as you make your living will:  ?? Do you know enough about life support methods that might be used? If not, talk to your doctor so you know what might be done if you can't breathe on your own, your heart stops, or you're unable to swallow.  ?? What things would you still want to be able to do after you receive life-support methods? Would you want to be able to walk? To speak? To eat on your own? To live without the help of machines?  ?? If you have a choice, where do you want to be cared for? In your home? At a hospital or nursing home?  ?? Do you want certain religious practices performed if you become very ill?  ?? If you have a choice at the end of your life, where would you prefer to die? At home? In a hospital or nursing home? Somewhere else?  ?? Would you prefer to be buried or cremated?  ?? Do you want your organs to be donated after you die?  What should you do with your living will?  ?? Make sure that your family members and your health care agent have copies of your living will.  ?? Give your doctor a copy of your living will to keep in your medical record. If you have more than one doctor, make sure that each one has a copy.   ?? You may want to put a copy of your living will where it can be easily found.  Where can you learn more?  Go to [email or web address]://www.[email or web address]/[email or web address].  Enter (567) 369-0631K356 in the search box to learn more about "Learning About Living Wills."  Current as of: July 21, 2015  Content Version: 11.3  ?? 2006-2017 [employer]wise[employer]. Care instructions adapted under license by [employer] (which disclaims liability or warranty for this information). If you have questions about a medical condition or this instruction, always ask your healthcare professional. Healthwise, Incorporated disclaims any warranty or liability for your use of this information.

## 2018-05-24 NOTE — Progress Notes (Signed)
Annual wellness.  Wants to discuss results of AAA ultrasound. Will provide copy of AMD.

## 2018-05-24 NOTE — Other (Signed)
Depression screen is negative, I have reviewed today.

## 2018-05-24 NOTE — Progress Notes (Signed)
Spoke with someone at the front desk. She said they dont normally add on tests but she will send a message to the doctor.

## 2018-05-24 NOTE — ACP (Advance Care Planning) (Signed)
Advance Care Planning (ACP) Provider Note - Comprehensive     Date of ACP Conversation: 05/24/18  Persons included in Conversation:  patient  Length of ACP Conversation in minutes: <16 minutes (Non-Billable)    Authorized Decision Maker (if patient is incapable of making informed decisions):   Healthcare Agent/Medical Power of Attorney under Advance Directive      She has an advanced directive - a copy HAS NOT been provided.  Reviewed DNR/DNI and patient is not interested.         General ACP for ALL Patients with Decision Making Capacity:  Importance of advance care planning, including choosing a healthcare agent to communicate patient's healthcare decisions if patient lost the ability to make decisions, such as after a sudden illness or accident  Understanding of the healthcare agent role was assessed and information provided  Opportunity offered to explore how cultural, religious, spiritual, or personal beliefs would affect decisions for future care  Exploration of values, goals, and preferences if recovery is not expected, even with continued medical treatment in the event of: Imminent death or severe, permanent brain injury    For Serious or Chronic Illness:  Understanding of CPR, goals and expected outcomes, benefits and burdens discussed.  Understanding of medical condition  Information on CPR success rates provided (e.g. for CPR in hospital, survival to d/c at two weeks is 22%, for chronically ill or elderly/frail survival is less than 3%)    Interventions Provided:  Recommended communicating the plan and making copies for the healthcare agent, personal physician, and others as appropriate (e.g., health system)  Recommended review of completed ACP document annually or upon change in health status

## 2018-05-25 LAB — COMPREHENSIVE METABOLIC PANEL
ALT: 17 IU/L (ref 0–32)
AST: 16 IU/L (ref 0–40)
Albumin/Globulin Ratio: 1.7 NA (ref 1.2–2.2)
Albumin: 4.5 g/dL (ref 3.5–4.8)
Alkaline Phosphatase: 91 IU/L (ref 39–117)
BUN: 11 mg/dL (ref 8–27)
Bun/Cre Ratio: 18 NA (ref 12–28)
CO2: 25 mmol/L (ref 20–29)
Calcium: 10 mg/dL (ref 8.7–10.3)
Chloride: 104 mmol/L (ref 96–106)
Creatinine: 0.6 mg/dL (ref 0.57–1.00)
EGFR IF NonAfrican American: 89 mL/min/{1.73_m2} (ref >59)
GFR African American: 102 mL/min/{1.73_m2} (ref >59)
Globulin, Total: 2.6 g/dL (ref 1.5–4.5)
Glucose: 80 mg/dL (ref 65–99)
Potassium: 4.6 mmol/L (ref 3.5–5.2)
Sodium: 144 mmol/L (ref 134–144)
Total Bilirubin: 0.4 mg/dL (ref 0.0–1.2)
Total Protein: 7.1 g/dL (ref 6.0–8.5)

## 2018-05-25 LAB — CBC
Hematocrit: 42.5 % (ref 34.0–46.6)
Hemoglobin: 13.9 g/dL (ref 11.1–15.9)
MCH: 29 pg (ref 26.6–33.0)
MCHC: 32.7 g/dL (ref 31.5–35.7)
MCV: 89 fL (ref 79–97)
Platelets: 260 10*3/uL (ref 150–450)
RBC: 4.79 x10E6/uL (ref 3.77–5.28)
RDW: 13.3 % (ref 12.3–15.4)
WBC: 5.7 10*3/uL (ref 3.4–10.8)

## 2018-05-25 LAB — LIPID PANEL
Cholesterol, Total: 182 mg/dL (ref 100–199)
Cholesterol, total: 182 mg/dL (ref 100–199)
HDL Cholesterol: 46 mg/dL (ref >39)
HDL: 46 mg/dL (ref >39)
LDL Calculated: 100 mg/dL — ABNORMAL HIGH (ref 0–99)
LDL, calculated: 100 mg/dL — ABNORMAL HIGH (ref 0–99)
Triglyceride: 180 mg/dL — ABNORMAL HIGH (ref 0–149)
Triglycerides: 180 mg/dL — ABNORMAL HIGH (ref 0–149)
VLDL Cholesterol Calculated: 36 mg/dL (ref 5–40)
VLDL, calculated: 36 mg/dL (ref 5–40)

## 2018-05-25 LAB — TSH 3RD GENERATION
TSH: 2.77 u[IU]/mL (ref 0.450–4.500)
TSH: 2.77 u[IU]/mL (ref 0.450–4.500)

## 2018-05-25 LAB — CBC W/O DIFF
HCT: 42.5 % (ref 34.0–46.6)
HGB: 13.9 g/dL (ref 11.1–15.9)
MCH: 29 pg (ref 26.6–33.0)
MCHC: 32.7 g/dL (ref 31.5–35.7)
MCV: 89 fL (ref 79–97)
PLATELET: 260 10*3/uL (ref 150–450)
RBC: 4.79 x10E6/uL (ref 3.77–5.28)
RDW: 13.3 % (ref 12.3–15.4)
WBC: 5.7 10*3/uL (ref 3.4–10.8)

## 2018-05-25 LAB — METABOLIC PANEL, COMPREHENSIVE
A-G Ratio: 1.7 (ref 1.2–2.2)
ALT (SGPT): 17 IU/L (ref 0–32)
AST (SGOT): 16 IU/L (ref 0–40)
Albumin: 4.5 g/dL (ref 3.5–4.8)
Alk. phosphatase: 91 IU/L (ref 39–117)
BUN/Creatinine ratio: 18 (ref 12–28)
BUN: 11 mg/dL (ref 8–27)
Bilirubin, total: 0.4 mg/dL (ref 0.0–1.2)
CO2: 25 mmol/L (ref 20–29)
Calcium: 10 mg/dL (ref 8.7–10.3)
Chloride: 104 mmol/L (ref 96–106)
Creatinine: 0.6 mg/dL (ref 0.57–1.00)
GFR est AA: 102 mL/min/{1.73_m2} (ref >59)
GFR est non-AA: 89 mL/min/{1.73_m2} (ref >59)
GLOBULIN, TOTAL: 2.6 g/dL (ref 1.5–4.5)
Glucose: 80 mg/dL (ref 65–99)
Potassium: 4.6 mmol/L (ref 3.5–5.2)
Protein, total: 7.1 g/dL (ref 6.0–8.5)
Sodium: 144 mmol/L (ref 134–144)

## 2018-05-25 NOTE — Telephone Encounter (Signed)
Spoke to Dr. Richardean SaleSandhu's office, patient is scheduled for a c-scope on 05/30/18 already, notes faxed for Dr. Steva ReadySandhu to review and he will do an EGD if indicated. Asked for them to return call to advise this can be done.

## 2018-06-13 ENCOUNTER — Ambulatory Visit: Attending: Internal Medicine

## 2018-06-13 ENCOUNTER — Encounter: Attending: Internal Medicine

## 2018-06-13 ENCOUNTER — Ambulatory Visit: Admit: 2018-06-13 | Discharge: 2018-06-13 | Payer: MEDICARE | Attending: Internal Medicine

## 2018-06-13 ENCOUNTER — Inpatient Hospital Stay: Admit: 2018-08-29 | Payer: MEDICARE

## 2018-06-13 DIAGNOSIS — R140 Abdominal distension (gaseous): Secondary | ICD-10-CM

## 2018-06-13 NOTE — Progress Notes (Signed)
Jaime Bailey is a 77 y.o. female who was seen in clinic today (06/13/2018) for an acute visit.          Assessment & Plan:     Diagnoses and all orders for this visit:    1. Abdominal bloating- this is a new problem, symptoms are: fluctuating and changing, differential dx reviewed with the patient, exact etiology is unclear at this time.  Would favor GB or pancreas related.  Will check labs and US.  Will recommend BRAT style diet, low fatty/fried foods.  Red flags were reviewed with the patient to RTC or notify me, expected time course for resolution reviewed.     -     HEPATIC FUNCTION PANEL  -     AMYLASE  -     LIPASE  -     US ABD LTD; Future         Follow-up and Dispositions    ?? Return if symptoms worsen or fail to improve.          Subjective:   Jaime Bailey was seen today for Abdominal Pain    Abdominal Pain  Jaime DuhamelKaye L Bailey is here to talk about abdominal bloating.  This has been an issue for the last 3 weeks ago.  Symptoms are are getting worse. Since  last visit did have colonoscopy (no polyp, no bx) & EGD (1 polyp, pathology unknown) done.  Pain has resolved and it was generalized and has resolved after a few days.  She reports having to have a BM 1-2 hrs after eating, this is improving, but also did not have much to eat yesterday today.  Stool was loose.  No blood or melena.  At times was pale.  She has tried tums and these have been: not very effective.     No fevers or chills.  No new foods or medications.      Prior to Admission medications    Medication Sig Start Date End Date Taking? Authorizing Provider   SYNTHROID 100 mcg tablet TAKE 1 TABLET BY MOUTH EVERY DAY 05/14/18  Yes Bud FaceHayes, Ellena Kamen P, MD   atorvastatin (LIPITOR) 10 mg tablet TAKE 1 TABLET BY MOUTH DAILY 05/14/18  Yes Bud FaceHayes, Summerlyn Fickel P, MD   naproxen sodium (ALEVE) 220 mg tablet Take 440 mg by mouth two (2) times daily (with meals).   Yes Provider, Historical   aspirin delayed-release 81 mg tablet Take 81 mg by mouth daily.   Yes Provider,  Historical          Allergies   Allergen Reactions   ??? Latex, Natural Rubber Itching           ROS - per HPI        Objective:   Physical Exam   Constitutional: No distress.   Eyes: Conjunctivae are normal. No scleral icterus.   Cardiovascular: Regular rhythm and normal heart sounds.   No murmur heard.  Pulmonary/Chest: Effort normal and breath sounds normal. She has no wheezes. She has no rales.   Abdominal: Bowel sounds are normal. She exhibits no mass. There is no hepatosplenomegaly. There is no tenderness.         Visit Vitals  BP 118/80   Pulse 63   Temp 97.9 ??F (36.6 ??C) (Oral)   Resp 16   Ht 5' 5.5" (1.664 m)   Wt 182 lb (82.6 kg)   SpO2 98%   BMI 29.83 kg/m??         Disclaimer:  Advised her  to call back or return to office if symptoms worsen/change/persist.  Discussed expected course/resolution/complications of diagnosis in detail with patient.    Medication risks/benefits/costs/interactions/alternatives discussed with patient.  She was given an after visit summary which includes diagnoses, current medications, & vitals.  She expressed understanding with the diagnosis and plan.      Aspects of this note may have been generated using voice recognition software. Despite editing, there may be some syntax errors.       Bud Facehristopher P Dennys Traughber, MD

## 2018-06-13 NOTE — Progress Notes (Signed)
Results released to patient via MyChart.  All labs are stable or at goal for her.

## 2018-06-13 NOTE — Progress Notes (Signed)
Still having some mild abdominal pain, especially with eating, also having bloating,  Having changes in bowels.

## 2018-06-13 NOTE — Progress Notes (Signed)
Still having some mild abdominal pain, especially with eating, also having bloating,  Having changes in bowels.

## 2018-06-13 NOTE — Progress Notes (Signed)
Jaime Bailey is a 77 y.o. female who was seen in clinic today (06/13/2018) for an acute visit.          Assessment & Plan:     Diagnoses and all orders for this visit:    1. Abdominal bloating- this is a new problem, symptoms are: fluctuating and changing, differential dx reviewed with the patient, exact etiology is unclear at this time.  Would favor GB or pancreas related.  Will check labs and US.  Will recommend BRAT style diet, low fatty/fried foods.  Red flags were reviewed with the patient to RTC or notify me, expected time course for resolution reviewed.     -     HEPATIC FUNCTION PANEL  -     AMYLASE  -     LIPASE  -     US ABD LTD; Future         Follow-up and Dispositions    ?? Return if symptoms worsen or fail to improve.          Subjective:   Jaime Bailey was seen today for Abdominal Pain    Abdominal Pain  Jaime DuhamelKaye L Farler is here to talk about abdominal bloating.  This has been an issue for the last 3 weeks ago.  Symptoms are are getting worse. Since  last visit did have colonoscopy (no polyp, no bx) & EGD (1 polyp, pathology unknown) done.  Pain has resolved and it was generalized and has resolved after a few days.  She reports having to have a BM 1-2 hrs after eating, this is improving, but also did not have much to eat yesterday today.  Stool was loose.  No blood or melena.  At times was pale.  She has tried tums and these have been: not very effective.     No fevers or chills.  No new foods or medications.      Prior to Admission medications    Medication Sig Start Date End Date Taking? Authorizing Provider   SYNTHROID 100 mcg tablet TAKE 1 TABLET BY MOUTH EVERY DAY 05/14/18  Yes Bud FaceHayes, Marolyn Urschel P, MD   atorvastatin (LIPITOR) 10 mg tablet TAKE 1 TABLET BY MOUTH DAILY 05/14/18  Yes Bud FaceHayes, Jadyn Barge P, MD   naproxen sodium (ALEVE) 220 mg tablet Take 440 mg by mouth two (2) times daily (with meals).   Yes Provider, Historical   aspirin delayed-release 81 mg tablet Take 81 mg by mouth daily.   Yes  Provider, Historical          Allergies   Allergen Reactions   ??? Latex, Natural Rubber Itching           ROS - per HPI        Objective:   Physical Exam   Constitutional: No distress.   Eyes: Conjunctivae are normal. No scleral icterus.   Cardiovascular: Regular rhythm and normal heart sounds.   No murmur heard.  Pulmonary/Chest: Effort normal and breath sounds normal. She has no wheezes. She has no rales.   Abdominal: Bowel sounds are normal. She exhibits no mass. There is no hepatosplenomegaly. There is no tenderness.         Visit Vitals  BP 118/80   Pulse 63   Temp 97.9 ??F (36.6 ??C) (Oral)   Resp 16   Ht 5' 5.5" (1.664 m)   Wt 182 lb (82.6 kg)   SpO2 98%   BMI 29.83 kg/m??         Disclaimer:  Advised her  to call back or return to office if symptoms worsen/change/persist.  Discussed expected course/resolution/complications of diagnosis in detail with patient.    Medication risks/benefits/costs/interactions/alternatives discussed with patient.  She was given an after visit summary which includes diagnoses, current medications, & vitals.  She expressed understanding with the diagnosis and plan.      Aspects of this note may have been generated using voice recognition software. Despite editing, there may be some syntax errors.       Bud Facehristopher P Dennys Traughber, MD

## 2018-06-14 LAB — HEPATIC FUNCTION PANEL
ALT (SGPT): 12 IU/L (ref 0–32)
ALT: 12 IU/L (ref 0–32)
AST (SGOT): 15 IU/L (ref 0–40)
AST: 15 IU/L (ref 0–40)
Albumin: 4.5 g/dL (ref 3.5–4.8)
Albumin: 4.5 g/dL (ref 3.5–4.8)
Alk. phosphatase: 85 IU/L (ref 39–117)
Alkaline Phosphatase: 85 IU/L (ref 39–117)
Bilirubin, Direct: 0.13 mg/dL (ref 0.00–0.40)
Bilirubin, direct: 0.13 mg/dL (ref 0.00–0.40)
Bilirubin, total: 0.6 mg/dL (ref 0.0–1.2)
Protein, total: 6.9 g/dL (ref 6.0–8.5)
Total Bilirubin: 0.6 mg/dL (ref 0.0–1.2)
Total Protein: 6.9 g/dL (ref 6.0–8.5)

## 2018-06-14 LAB — AMYLASE
Amylase: 34 U/L (ref 31–124)
Amylase: 34 U/L (ref 31–124)

## 2018-06-14 LAB — LIPASE
Lipase: 28 U/L (ref 14–85)
Lipase: 28 U/L (ref 14–85)

## 2018-06-30 ENCOUNTER — Inpatient Hospital Stay: Admit: 2018-06-30 | Payer: MEDICARE | Attending: Internal Medicine

## 2018-06-30 DIAGNOSIS — K8020 Calculus of gallbladder without cholecystitis without obstruction: Secondary | ICD-10-CM

## 2018-06-30 NOTE — Progress Notes (Signed)
Results reviewed.  Results released via MyChart.  Ultrasound is abnormal, shows gall stones and fatty liver. Will have her see surgeon.

## 2018-06-30 NOTE — Progress Notes (Signed)
Results reviewed.  Results released via MyChart.  Ultrasound is abnormal, shows gall stones and fatty liver. Will have her see surgeon.

## 2018-07-21 ENCOUNTER — Ambulatory Visit: Attending: Internal Medicine

## 2018-07-21 ENCOUNTER — Ambulatory Visit: Admit: 2018-07-21 | Discharge: 2018-07-21 | Payer: MEDICARE | Attending: Internal Medicine

## 2018-07-21 DIAGNOSIS — J029 Acute pharyngitis, unspecified: Secondary | ICD-10-CM

## 2018-07-21 MED ORDER — OMEPRAZOLE 40 MG CAP, DELAYED RELEASE
40 mg | ORAL_CAPSULE | Freq: Every day | ORAL | 0 refills | Status: DC
Start: 2018-07-21 — End: 2019-05-21

## 2018-07-21 NOTE — Progress Notes (Signed)
Cough, congestion, sneezing, head stuffy, fatigued.  Had sore throat initially, subsided.

## 2018-07-21 NOTE — Progress Notes (Signed)
Jaime Bailey is a 77 y.o. female who was seen in clinic today (07/21/2018) for an acute visit.  She will be moving to [facility] next week.          Assessment & Plan:   Diagnoses and all orders for this visit:    1. Sore throat- this is a new problem, symptoms are unchanged, differential dx reviewed with the patient, exact etiology is unclear at this time.  Favor due to silent reflux more then post nasal drip or infection.  Will start on meds below, reviewed expectations.  If no changes in 7-10 days reviewed next step.  Trial of Flonase.  Red flags were reviewed with the patient to RTC or notify me, expected time course for resolution reviewed.     -     omeprazole (PRILOSEC) 40 mg capsule; Take 1 Cap by mouth daily.         Follow-up and Dispositions    ?? Follow up - PRN        Subjective:   Jaime Bailey was seen today for Cold Symptoms    URI Review  Jaime Bailey returns to clinic today to talk about: possible allergies for 1 month ago, which are unchanged since that time.  Started w/ a sore throat (worse in the morning) and does not feel good.  She also reports reflux and epigastric pain on/off attributed to her gall bladder.  She denies a history of: fever, headache, sinus congestion, post nasal drip, rhinorrhea, chest congestion, wheezing and coughing.  Treatments have included: Claritin x 1 w/o any relief.  Relevant PMH: No pertinent additional PMH, no h/o allergies.  Patient reports sick contacts: no.       She was in [place] 1 month ago for a funeral, around a lot of smoke.  Did travel to [facility] last week.  Will be moving there next week.        Prior to Admission medications    Medication Sig Start Date End Date Taking? Authorizing Provider   loratadine (CLARITIN PO) Take  by mouth as needed.   Yes Provider, Historical   SYNTHROID 100 mcg tablet TAKE 1 TABLET BY MOUTH EVERY DAY 05/14/18  Yes Bud Face, MD   atorvastatin (LIPITOR) 10 mg tablet TAKE 1 TABLET BY MOUTH DAILY 05/14/18  Yes Bud Face, MD   naproxen sodium  (ALEVE) 220 mg tablet Take 440 mg by mouth two (2) times daily (with meals).   Yes Provider, Historical   aspirin delayed-release 81 mg tablet Take 81 mg by mouth daily.   Yes Provider, Historical          Allergies   Allergen Reactions   ??? Latex, Natural Rubber Itching           ROS - per HPI        Objective:   Physical Exam   Constitutional: No distress.   HENT:   Right Ear: Tympanic membrane is not erythematous and not bulging. No middle ear effusion.   Left Ear: Tympanic membrane is not erythematous and not bulging.  No middle ear effusion.   Nose: No mucosal edema or rhinorrhea. Right sinus exhibits no maxillary sinus tenderness and no frontal sinus tenderness. Left sinus exhibits no maxillary sinus tenderness and no frontal sinus tenderness.   Mouth/Throat: Uvula is midline and mucous membranes are normal. No oropharyngeal exudate or posterior oropharyngeal erythema.   Eyes: Conjunctivae are normal. No scleral icterus.   Neck: Neck supple.   Cardiovascular: Regular rhythm  and normal heart sounds.   No murmur heard.  Pulmonary/Chest: Effort normal and breath sounds normal. She has no wheezes. She has no rales.   Abdominal: Bowel sounds are normal. She exhibits no mass. There is no hepatosplenomegaly. There is no tenderness.   Lymphadenopathy:     She has no cervical adenopathy.         Visit Vitals  BP 112/82   Pulse 64   Temp 98.3 ??F (36.8 ??C) (Oral)   Resp 16   Ht 5' 5.5" (1.664 m)   Wt 182 lb (82.6 kg)   SpO2 96%   BMI 29.83 kg/m??         Disclaimer:  Advised her to call back or return to office if symptoms worsen/change/persist.  Discussed expected course/resolution/complications of diagnosis in detail with patient.    Medication risks/benefits/costs/interactions/alternatives discussed with patient.  She was given an after visit summary which includes diagnoses, current medications, & vitals.  She expressed understanding with the diagnosis and plan.      Aspects of this note may have been generated using  voice recognition software. Despite editing, there may be some syntax errors.       Bud Facehristopher P Rhian Funari, MD

## 2018-07-21 NOTE — Progress Notes (Signed)
Cough, congestion, sneezing, head stuffy, fatigued.  Had sore throat initially, subsided.

## 2018-07-21 NOTE — Progress Notes (Signed)
Jaime Bailey is a 77 y.o. female who was seen in clinic today (07/21/2018) for an acute visit.  [place]          Assessment & Plan:   Diagnoses and all orders for this visit:    1. Sore throat- this is a new problem, symptoms are unchanged, differential dx reviewed with the patient, exact etiology is unclear at this time.  Favor due to silent reflux more then post nasal drip or infection.  Will start on meds below, reviewed expectations.  If no changes in 7-10 days reviewed next step.  Trial of Flonase.  Red flags were reviewed with the patient to RTC or notify me, expected time course for resolution reviewed.     -     omeprazole (PRILOSEC) 40 mg capsule; Take 1 Cap by mouth daily.         Follow-up and Dispositions    ?? Follow up - PRN        Subjective:   Jaime Bailey was seen today for Cold Symptoms    URI Review  Jaime Bailey returns to clinic today to talk about: possible allergies for 1 month ago, which are unchanged since that time.  Started w/ a sore throat (worse in the morning) and does not feel good.  She also reports reflux and epigastric pain on/off attributed to her gall bladder.  She denies a history of: fever, headache, sinus congestion, post nasal drip, rhinorrhea, chest congestion, wheezing and coughing.  Treatments have included: Claritin x 1 w/o any relief.  Relevant PMH: No pertinent additional PMH, no h/o allergies.  Patient reports sick contacts: no.       [place], around a lot of smoke.  [place]  [place] be [place] there next week.        Prior to Admission medications    Medication Sig Start Date End Date Taking? Authorizing Provider   loratadine (CLARITIN PO) Take  by mouth as needed.   Yes Provider, Historical   SYNTHROID 100 mcg tablet TAKE 1 TABLET BY MOUTH EVERY DAY 05/14/18  Yes Bud FaceHayes, Ellias Mcelreath P, MD   atorvastatin (LIPITOR) 10 mg tablet TAKE 1 TABLET BY MOUTH DAILY 05/14/18  Yes Bud FaceHayes, Caydin Yeatts P, MD    naproxen sodium (ALEVE) 220 mg tablet Take 440 mg by mouth two (2) times daily (with meals).   Yes Provider, Historical   aspirin delayed-release 81 mg tablet Take 81 mg by mouth daily.   Yes Provider, Historical          Allergies   Allergen Reactions   ??? Latex, Natural Rubber Itching           ROS - per HPI        Objective:   Physical Exam   Constitutional: No distress.   HENT:   Right Ear: Tympanic membrane is not erythematous and not bulging. No middle ear effusion.   Left Ear: Tympanic membrane is not erythematous and not bulging.  No middle ear effusion.   Nose: No mucosal edema or rhinorrhea. Right sinus exhibits no maxillary sinus tenderness and no frontal sinus tenderness. Left sinus exhibits no maxillary sinus tenderness and no frontal sinus tenderness.   Mouth/Throat: Uvula is midline and mucous membranes are normal. No oropharyngeal exudate or posterior oropharyngeal erythema.   Eyes: Conjunctivae are normal. No scleral icterus.   Neck: Neck supple.   Cardiovascular: Regular rhythm  and normal heart sounds.   No murmur heard.  Pulmonary/Chest: Effort normal and breath sounds normal. She has no wheezes. She has no rales.   Abdominal: Bowel sounds are normal. She exhibits no mass. There is no hepatosplenomegaly. There is no tenderness.   Lymphadenopathy:     She has no cervical adenopathy.         Visit Vitals  BP 112/82   Pulse 64   Temp 98.3 ??F (36.8 ??C) (Oral)   Resp 16   Ht 5' 5.5" (1.664 m)   Wt 182 lb (82.6 kg)   SpO2 96%   BMI 29.83 kg/m??         Disclaimer:  Advised her to call back or return to office if symptoms worsen/change/persist.  Discussed expected course/resolution/complications of diagnosis in detail with patient.    Medication risks/benefits/costs/interactions/alternatives discussed with patient.  She was given an after visit summary which includes diagnoses, current medications, & vitals.  She expressed understanding with the diagnosis and plan.       Aspects of this note may have been generated using voice recognition software. Despite editing, there may be some syntax errors.       Bud Face, MD

## 2018-08-10 ENCOUNTER — Encounter

## 2018-08-10 MED ORDER — SYNTHROID 100 MCG TABLET
100 mcg | ORAL_TABLET | ORAL | 0 refills | Status: DC
Start: 2018-08-10 — End: 2019-02-04

## 2018-08-10 MED ORDER — ATORVASTATIN 10 MG TAB
10 mg | ORAL_TABLET | ORAL | 0 refills | Status: DC
Start: 2018-08-10 — End: 2018-11-10

## 2018-08-10 NOTE — Telephone Encounter (Signed)
[place]  [place], will need to get thru new PCP

## 2018-11-10 ENCOUNTER — Encounter

## 2018-11-10 MED ORDER — ATORVASTATIN 10 MG TAB
10 mg | ORAL_TABLET | ORAL | 1 refills | Status: DC
Start: 2018-11-10 — End: 2019-05-07

## 2018-11-10 NOTE — Telephone Encounter (Signed)
VORB Dr. Madilyn FiremanHayes.

## 2019-01-24 ENCOUNTER — Encounter: Attending: Internal Medicine

## 2019-01-29 ENCOUNTER — Encounter: Attending: Internal Medicine

## 2019-02-04 ENCOUNTER — Encounter

## 2019-02-05 MED ORDER — SYNTHROID 100 MCG TABLET
100 mcg | ORAL_TABLET | ORAL | 0 refills | Status: DC
Start: 2019-02-05 — End: 2019-05-11

## 2019-05-07 ENCOUNTER — Encounter

## 2019-05-07 NOTE — Telephone Encounter (Signed)
Please call patient.  Due for f/u in June.  Should schedule f/u in 2-3 wks.

## 2019-05-08 MED ORDER — ATORVASTATIN 10 MG TAB
10 mg | ORAL_TABLET | ORAL | 0 refills | Status: DC
Start: 2019-05-08 — End: 2019-07-26

## 2019-05-11 ENCOUNTER — Encounter

## 2019-05-11 MED ORDER — SYNTHROID 100 MCG TABLET
100 mcg | ORAL_TABLET | ORAL | 0 refills | Status: DC
Start: 2019-05-11 — End: 2019-07-08

## 2019-05-11 NOTE — Telephone Encounter (Signed)
Called last week regarding need for appt

## 2019-05-21 ENCOUNTER — Telehealth: Attending: Internal Medicine

## 2019-05-21 ENCOUNTER — Telehealth: Admit: 2019-05-21 | Discharge: 2019-05-21 | Payer: MEDICARE | Attending: Internal Medicine

## 2019-05-21 DIAGNOSIS — Z0000 Encounter for general adult medical examination without abnormal findings: Secondary | ICD-10-CM

## 2019-05-21 NOTE — Progress Notes (Signed)
Jaime Bailey is a 78 y.o. female who was seen by synchronous (real-time) audio-video technology on 05/21/2019.        Consent: Jaime Bailey who was seen by synchronous (real-time) audio-video technology, and/or her healthcare decision maker, is aware that this patient-initiated, Telehealth encounter on 05/21/2019 is a billable service, with coverage as determined by her insurance carrier. She is aware that she may receive a bill and has provided verbal consent to proceed: Yes.     Patient identification was verified prior to start of the visit. A caregiver was present when appropriate. Due to this being a Scientist, physiological (during EAVWU-98 public health emergency), evaluation of the following organ systems was limited: VS/Constituional/EENT/Resp/CV/GI/GU/MS/Neuro/Skin/Heme-Lymph-Imm.  Pursuant to the emergency declaration under the [place], 1135 waiver authority and the [employer] Act, this Virtual Visit was conducted, with patient's (and/or legal guardian's) consent, to reduce the patient's risk of exposure to COVID-19 and provide necessary medical care.     Services were provided through a synchronous discussion virtually to substitute for in-person clinic visit. I was at home. The patient was at home.      Assessment & Plan:     Diagnoses and all orders for this visit:    1. Medicare annual wellness visit, subsequent    2. Advanced care planning/counseling discussion    3. Screening for depression  -     PR DEPRESSION SCREEN ANNUAL    4. Hypothyroidism due to acquired atrophy of thyroid- previously well controlled, asymptomatic, no changes pending review of labs  -     TSH 3RD GENERATION    5. Pure hypercholesterolemia- well controlled, continue current treatment pending review of labs   -     METABOLIC PANEL, COMPREHENSIVE  -     LIPID PANEL    6. Age-related osteoporosis without current pathological fracture- reviewed  DEXA from '18, recommended Acacian due to abnormal wrist viewed osteopenia in hip & spine, continue with weightbearing exercises, will defer imaging    7. SOB (shortness of breath)- this is a chronic problem, symptoms may be slightly worse but difficult to discern, differential dx reviewed with the patient, exact etiology is unclear at this time.  Will check labs and imaging.  Due to potential upcoming blephloplasty will get stress test set up.  Did have TTE in Oct '17 (normal).  Red flags were reviewed with the patient to RTC or notify me, expected time course for resolution reviewed.     -     CBC W/O DIFF  -     XR CHEST PA LAT; Future      Follow-up and Dispositions    ?? Return in about 1 year (around 05/20/2020), or if symptoms worsen or fail to improve, for FULL PHYSICAL - 30 minutes.         Subjective:   Jaime Bailey was seen for Annual Wellness Visit; Hypothyroidism; and Cholesterol Problem    Subjective:   Annual Wellness Visit- Subsequent Visit    End of Life Planning: This was discussed with her today and she has an advanced directive - a copy HAS NOT been provided.  Reviewed DNR/DNI and patient is not interested.      Depression Screen:  3 most recent PHQ Screens 05/21/2019   Little interest or pleasure in doing things Not at all   Feeling down, depressed, irritable, or hopeless Not at all   Total Score PHQ 2 0  Fall Risk:   Fall Risk Assessment, last 12 mths 05/21/2019   Able to walk? Yes   Fall in past 12 months? No   Fall with injury? -   Number of falls in past 12 months -   Fall Risk Score -       Abuse Screen:  Abuse Screening Questionnaire 05/21/2019   Do you ever feel afraid of your partner? N   Are you in a relationship with someone who physically or mentally threatens you? N   Is it safe for you to go home? Y       Alcohol Risk Factor Screening:  Alcohol Risk Factor Screening:   Do you average 1 drink per night or more than 7 drinks a week:  No    On any one occasion in the past three months  have you have had more than 3 drinks containing alcohol:  No      Functional Ability and Level of Safety:   Hearing Screen: The patient needs further evaluation.    Cognition Screen:  Has your family/caregiver stated any concerns about your memory: no    Ambulation: with no difficulty    Activities of Daily Living:    Exercise: moderately active  The home contains: no safety equipment.  Patient does total self care    Adult Nutrition Screen:  No risk factors noted.       Health Maintenance:   Daily Aspirin: yes  Bone Density: done 12/22/16 - osteoporosis   Glaucoma Screening: records requested    Immunizations:   Influenza: up to date  Tetanus: up to date  Shingles: not up to date - she will look into after social distancing guidelines relax.  Pneumonia: up to date  Cancer screening:   Cervical: reviewed guidelines, n/a  Breast: reviewed guidelines, she would like to defer this year.  Colon: reviewed guidelines, up to date       Patient Care Team:  Bud FaceHayes, Syan Cullimore P, MD as PCP - General (Internal Medicine)  Bud FaceHayes, Livio Ledwith P, MD as PCP - [facility] Empaneled Provider  Sandhu, Alfonzo BeersBimaljit S, MD (Gastroenterology)  Gaspar Skeetersobinson, Grover C IV, MD (Ophthalmology)       In addition to the above issues we also reviewed the following acute and/or chronic problems:    Endocrine Review  Patient is seen for followup of hypothyroidism.  Since last visit: no changes.  She reports medication compliance: all the time and is taking separate from all other meds.  She reports the following concerns/problems/med side effects: none.  Lab review: labs reviewed and discussed with patient.      Cardiovascular Review  The patient has hyperlipidemia.  Since last visit: no changes.  She reports taking medications as instructed, no medication side effects noted.  Diet and Lifestyle: generally follows a low fat low cholesterol diet, exercises sporadically.  Labs: reviewed and discussed with patient.         The following sections were reviewed &  updated as appropriate: PMH, PSH, FH, and SH.         Prior to Admission medications    Medication Sig Start Date End Date Taking? Authorizing Provider   Synthroid 100 mcg tablet TAKE 1 TABLET BY MOUTH EVERY DAY.  Appointment required prior to any further refills 05/11/19  Yes Bud FaceHayes, Yusra Ravert P, MD   atorvastatin (LIPITOR) 10 mg tablet TAKE 1 TABLET BY MOUTH DAILY 05/07/19  Yes Bud FaceHayes, Sung Renton P, MD   naproxen sodium (ALEVE) 220 mg tablet Take 440 mg  by mouth two (2) times daily (with meals).   Yes Provider, Historical   aspirin delayed-release 81 mg tablet Take 81 mg by mouth daily.   Yes Provider, Historical   loratadine (CLARITIN PO) Take  by mouth as needed.  05/21/19  Provider, Historical   omeprazole (PRILOSEC) 40 mg capsule Take 1 Cap by mouth daily. 07/21/18 05/21/19  Bud FaceHayes, Phila Shoaf P, MD       Allergies   Allergen Reactions   ??? Latex, Natural Rubber Itching         Review of Systems   Constitutional: Negative for malaise/fatigue and weight loss.   Eyes:        Had cataract surgery since last visit, is now contemplating bilateral eye lid lift   Respiratory: Positive for shortness of breath (only with walking, not an issue w/ stairs and ADL's, present for a few years, not getting better or worse). Negative for cough.    Cardiovascular: Negative for chest pain, palpitations and leg swelling.   Gastrointestinal: Negative for abdominal pain, constipation, diarrhea, heartburn, nausea and vomiting.   Musculoskeletal: Negative for joint pain and myalgias.   Skin: Negative for rash.   Neurological: Negative for dizziness and headaches.   Psychiatric/Behavioral: Negative for depression. The patient is not nervous/anxious and does not have insomnia.           Objective:     General: alert, cooperative, no distress   Mental  status: normal mood, behavior, speech, dress, motor activity, and thought processes, able to follow commands   Eyes: normal sclera   Mouth:    Neck: no visualized mass   Resp: normal effort, no  respiratory distress and talking in full sentences   Neuro: no gross deficits   Musculoskeletal:    Skin: no discoloration or lesions of concern on visible areas   Psychiatric: normal affect         We discussed the expected course, resolution and complications of the diagnosis(es) in detail.  Medication risks, benefits, costs, interactions, and alternatives were discussed as indicated.  I advised her to contact the office if her condition worsens, changes or fails to improve as anticipated. She expressed understanding with the diagnosis(es) and plan.     Bud Facehristopher P Cyniah Gossard, MD

## 2019-05-21 NOTE — ACP (Advance Care Planning) (Signed)
Advance Care Planning     Advance Care Planning (ACP) Physician/NP/PA (Provider) Conversation    Date of ACP Conversation: 05/21/19  Persons included in Conversation:  patient  Length of ACP Conversation in minutes: <16 minutes (Non-Billable)    Authorized Management consultant (if patient is incapable of making informed decisions):   Named in Forensic psychologist        She has an advanced directive - a copy HAS NOT been provided.  Reviewed DNR/DNI and patient is not interested.         Care Preferences:    Hospitalization:  "If your health worsens and it becomes clear that your chance of recovery is unlikely, what would your preference be regarding hospitalization?"  Yes, patient would want hospitalization    Resuscitation:  "In the event your heart stopped as a result of an underlying serious health condition, would you want attempts to be made to restart your heart?"   Yes, patient would want to attempt CPR    Ventilation:  "If you were in your present state of health and suddenly became very ill and were unable to breathe on your own, what would your preference be about the use of a ventilator (breathing machine) if it was available to you?"    Yes, patient would desire the use of a ventilator      Bud Face, MD

## 2019-05-21 NOTE — Progress Notes (Signed)
Follow up.   Will provide copy of AMD

## 2019-05-21 NOTE — Progress Notes (Signed)
Results released to patient via MyChart.  All labs are stable or at goal for her.

## 2019-05-21 NOTE — Progress Notes (Signed)
Follow up.   Will provide copy of AMD

## 2019-05-21 NOTE — ACP (Advance Care Planning) (Signed)
Advance Care Planning     Advance Care Planning (ACP) Physician/NP/PA (Provider) Conversation    Date of ACP Conversation: 05/21/19  Persons included in Conversation:  patient  Length of ACP Conversation in minutes: <16 minutes (Non-Billable)    Authorized Decision Maker (if patient is incapable of making informed decisions):   Named in Advance Directive or Healthcare Power of Attorney        She has an advanced directive - a copy HAS NOT been provided.  Reviewed DNR/DNI and patient is not interested.         Care Preferences:    Hospitalization:  "If your health worsens and it becomes clear that your chance of recovery is unlikely, what would your preference be regarding hospitalization?"  Yes, patient would want hospitalization    Resuscitation:  "In the event your heart stopped as a result of an underlying serious health condition, would you want attempts to be made to restart your heart?"   Yes, patient would want to attempt CPR    Ventilation:  "If you were in your present state of health and suddenly became very ill and were unable to breathe on your own, what would your preference be about the use of a ventilator (breathing machine) if it was available to you?"    Yes, patient would desire the use of a ventilator      Armetta Henri P Navil Kole, MD

## 2019-05-21 NOTE — Patient Instructions (Signed)
Medicare Wellness Visit, Female     The best way to live healthy is to have a lifestyle where you eat a well-balanced diet, exercise regularly, limit alcohol use, and quit all forms of tobacco/nicotine, if applicable.     Regular preventive services are another way to keep healthy. Preventive services (vaccines, screening tests, monitoring & exams) can help personalize your care plan, which helps you manage your own care. Screening tests can find health problems at the earliest stages, when they are easiest to treat.   [facility] follows the current, evidence-based guidelines published by the [place] [employer]ventive Services Task Force (USPSTF) when recommending preventive services for our patients. Because we follow these guidelines, sometimes recommendations change over time as research supports it. (For example, mammograms used to be recommended annually. Even though Medicare will still pay for an annual mammogram, the newer guidelines recommend a mammogram every two years for women of average risk).  Of course, you and your doctor may decide to screen more often for some diseases, based on your risk and your co-morbidities (chronic disease you are already diagnosed with).     Preventive services for you include:  - Medicare offers their members a free annual wellness visit, which is time for you and your primary care provider to discuss and plan for your preventive service needs. Take advantage of this benefit every year!  -All adults over the age of 77 should receive the recommended pneumonia vaccines. Current USPSTF guidelines recommend a series of two vaccines for the best pneumonia protection.   -All adults should have a flu vaccine yearly and a tetanus vaccine every 10 years.   -All adults age 150 and older should receive the shingles vaccines (series of two vaccines).      -All adults age 77-70 who are overweight should have a diabetes screening test once every three years.    -All adults born between [id number] and 1965 should be screened once for Hepatitis C.  -Other screening tests and preventive services for persons with diabetes include: an eye exam to screen for diabetic retinopathy, a kidney function test, a foot exam, and stricter control over your cholesterol.   -Cardiovascular screening for adults with routine risk involves an electrocardiogram (ECG) at intervals determined by your doctor.   -Colorectal cancer screenings should be done for adults age 77-75 with no increased risk factors for colorectal cancer.  There are a number of acceptable methods of screening for this type of cancer. Each test has its own benefits and drawbacks. Discuss with your doctor what is most appropriate for you during your annual wellness visit. The different tests include: colonoscopy (considered the best screening method), a fecal occult blood test, a fecal DNA test, and sigmoidoscopy.    -A bone mass density test is recommended when a woman turns 65 to screen for osteoporosis. This test is only recommended one time, as a screening. Some providers will use this same test as a disease monitoring tool if you already have osteoporosis.  -Breast cancer screenings are recommended every other year for women of normal risk, age 77-74.  -Cervical cancer screenings for women over age 77 are only recommended with certain risk factors.     Here is a list of your current Health Maintenance items (your personalized list of preventive services) with a due date:  Health Maintenance Due   Topic Date Due   ??? Shingles Vaccine (1 of 2) 03/03/1992   ??? Glaucoma Screening   11/26/2018   ???  Annual Well Visit  05/25/2019   ??? Cholesterol Test   05/25/2019              Learning About Living Eugenie Birks  What is a living will?  A living will is a legal form you use to write down the kind of care you want at the end of your life. It is used by the health professionals who will treat you if you aren't able to decide for yourself.   If you put your wishes in writing, your loved ones and others will know what kind of care you want. They won't need to guess. This can ease your mind and be helpful to others.  A living will is not the same as an estate or property will. An estate will explains what you want to happen with your money and property after you die.  Is a living will a legal document?  A living will is a legal document. Each state has its own laws about living wills. If you move to another state, make sure that your living will is legal in the state where you now live. Or you might use a universal form that has been approved by many states. This kind of form can sometimes be completed and stored online. Your electronic copy will then be available wherever you have a connection to the Internet. In most cases, doctors will respect your wishes even if you have a form from a different state.  ?? You don't need an attorney to complete a living will. But legal advice can be helpful if your state's laws are unclear, your health history is complicated, or your family can't agree on what should be in your living will.  ?? You can change your living will at any time. Some people find that their wishes about end-of-life care change as their health changes.  ?? In addition to making a living will, think about completing a medical power of attorney form. This form lets you name the person you want to make end-of-life treatment decisions for you (your "health care agent") if you're not able to. Many hospitals and nursing homes will give you the forms you need to complete a living will and a medical power of attorney.  ?? Your living will is used only if you can't make or communicate decisions for yourself anymore. If you become able to make decisions again, you can accept or refuse any treatment, no matter what you wrote in your living will.  ?? Your state may offer an online registry. This is a place where you can  store your living will online so the doctors and nurses who need to treat you can find it right away.  What should you think about when creating a living will?  Talk about your end-of-life wishes with your family members and your doctor. Let them know what you want. That way the people making decisions for you won't be surprised by your choices.  Think about these questions as you make your living will:  ?? Do you know enough about life support methods that might be used? If not, talk to your doctor so you know what might be done if you can't breathe on your own, your heart stops, or you're unable to swallow.  ?? What things would you still want to be able to do after you receive life-support methods? Would you want to be able to walk? To speak? To eat on your own? To live without the help of  machines?  ?? If you have a choice, where do you want to be cared for? In your home? At a hospital or nursing home?  ?? Do you want certain religious practices performed if you become very ill?  ?? If you have a choice at the end of your life, where would you prefer to die? At home? In a hospital or nursing home? Somewhere else?  ?? Would you prefer to be buried or cremated?  ?? Do you want your organs to be donated after you die?  What should you do with your living will?  ?? Make sure that your family members and your health care agent have copies of your living will.  ?? Give your doctor a copy of your living will to keep in your medical record. If you have more than one doctor, make sure that each one has a copy.  ?? You may want to put a copy of your living will where it can be easily found.  Where can you learn more?  Go to [email or web address].  Enter [phone] in the search box to learn more about "Learning About Living Wills."  Current as of: July 21, 2015  Content Version: 11.3  ?? 2006-2017 Health[employer], [employer]. Care instructions adapted under  license by [employer] (which disclaims liability or warranty for this information). If you have questions about a medical condition or this instruction, always ask your healthcare professional. [place] any warranty or liability for your use of this information.

## 2019-05-21 NOTE — Progress Notes (Signed)
Jaime Bailey is a 78 y.o. female who was seen by synchronous (real-time) audio-video technology on 05/21/2019.        Consent: Jaime Bailey who was seen by synchronous (real-time) audio-video technology, and/or her healthcare decision maker, is aware that this patient-initiated, Telehealth encounter on 05/21/2019 is a billable service, with coverage as determined by her insurance carrier. She is aware that she may receive a bill and has provided verbal consent to proceed: Yes.     Patient identification was verified prior to start of the visit. A caregiver was present when appropriate. Due to this being a Scientist, physiological (during NUUVO-53 public health emergency), evaluation of the following organ systems was limited: VS/Constituional/EENT/Resp/CV/GI/GU/MS/Neuro/Skin/Heme-Lymph-Imm.  Pursuant to the emergency declaration under the [place], 1135 waiver authority and the [employer] Act, this Virtual Visit was conducted, with patient'Bailey (and/or legal guardian'Bailey) consent, to reduce the patient'Bailey risk of exposure to COVID-19 and provide necessary medical care.     Services were provided through a synchronous discussion virtually to substitute for in-person clinic visit. I was at home. The patient was at home.      Assessment & Plan:     Diagnoses and all orders for this visit:    1. Medicare annual wellness visit, subsequent    2. Advanced care planning/counseling discussion    3. Screening for depression  -     PR DEPRESSION SCREEN ANNUAL    4. Hypothyroidism due to acquired atrophy of thyroid- previously well controlled, asymptomatic, no changes pending review of labs  -     TSH 3RD GENERATION    5. Pure hypercholesterolemia- well controlled, continue current treatment pending review of labs   -     METABOLIC PANEL, COMPREHENSIVE  -     LIPID PANEL    6. Age-related osteoporosis without current pathological fracture-  reviewed DEXA from '18, recommended Acacian due to abnormal wrist viewed osteopenia in hip & spine, continue with weightbearing exercises, will defer imaging    7. SOB (shortness of breath)- this is a chronic problem, symptoms may be slightly worse but difficult to discern, differential dx reviewed with the patient, exact etiology is unclear at this time.  Will check labs and imaging.  Due to potential upcoming blephloplasty will get stress test set up.  Did have TTE in Oct '17 (normal).  Red flags were reviewed with the patient to RTC or notify me, expected time course for resolution reviewed.     -     CBC W/O DIFF  -     XR CHEST PA LAT; Future      Follow-up and Dispositions    ?? Return in about 1 year (around 05/20/2020), or if symptoms worsen or fail to improve, for FULL PHYSICAL - 30 minutes.         Subjective:   Jaime Bailey was seen for Annual Wellness Visit; Hypothyroidism; and Cholesterol Problem    Subjective:   Annual Wellness Visit- Subsequent Visit    End of Life Planning: This was discussed with her today and she has an advanced directive - a copy HAS NOT been provided.  Reviewed DNR/DNI and patient is not interested.      Depression Screen:  3 most recent PHQ Screens 05/21/2019   Little interest or pleasure in doing things Not at all   Feeling down, depressed, irritable, or hopeless Not at all   Total Score PHQ 2 0  Fall Risk:   Fall Risk Assessment, last 12 mths 05/21/2019   Able to walk? Yes   Fall in past 12 months? No   Fall with injury? -   Number of falls in past 12 months -   Fall Risk Score -       Abuse Screen:  Abuse Screening Questionnaire 05/21/2019   Do you ever feel afraid of your partner? N   Are you in a relationship with someone who physically or mentally threatens you? N   Is it safe for you to go home? Y       Alcohol Risk Factor Screening:  Alcohol Risk Factor Screening:   Do you average 1 drink per night or more than 7 drinks a week:  No     On any one occasion in the past three months have you have had more than 3 drinks containing alcohol:  No      Functional Ability and Level of Safety:   Hearing Screen: The patient needs further evaluation.    Cognition Screen:  Has your family/caregiver stated any concerns about your memory: no    Ambulation: with no difficulty    Activities of Daily Living:    Exercise: moderately active  The home contains: no safety equipment.  Patient does total self care    Adult Nutrition Screen:  No risk factors noted.       Health Maintenance:   Daily Aspirin: yes  Bone Density: done 12/22/16 - osteoporosis   Glaucoma Screening: records requested    Immunizations:   Influenza: up to date  Tetanus: up to date  Shingles: not up to date - she will look into after social distancing guidelines relax.  Pneumonia: up to date  Cancer screening:   Cervical: reviewed guidelines, n/a  Breast: reviewed guidelines, she would like to defer this year.  Colon: reviewed guidelines, up to date       Patient Care Team:  Jaime FaceHayes, Lennin Osmond P, MD as PCP - General (Internal Medicine)  Jaime FaceHayes, Marlo Goodrich P, MD [facility]CP - [facility]  Sandhu, Alfonzo BeersBimaljit S, MD (Gastroenterology)  Jaime Skeetersobinson, Grover C IV, MD (Ophthalmology)       In addition to the above issues we also reviewed the following acute and/or chronic problems:    Endocrine Review  Patient is seen for followup of hypothyroidism.  Since last visit: no changes.  She reports medication compliance: all the time and is taking separate from all other meds.  She reports the following concerns/problems/med side effects: none.  Lab review: labs reviewed and discussed with patient.      Cardiovascular Review  The patient has hyperlipidemia.  Since last visit: no changes.  She reports taking medications as instructed, no medication side effects noted.  Diet and Lifestyle: generally follows a low fat low cholesterol diet, exercises sporadically.  Labs: reviewed and discussed with patient.          The following sections were reviewed & updated as appropriate: PMH, PSH, FH, and SH.         Prior to Admission medications    Medication Sig Start Date End Date Taking? Authorizing Provider   Synthroid 100 mcg tablet TAKE 1 TABLET BY MOUTH EVERY DAY.  Appointment required prior to any further refills 05/11/19  Yes Jaime FaceHayes, Carlton Buskey P, MD   atorvastatin (LIPITOR) 10 mg tablet TAKE 1 TABLET BY MOUTH DAILY 05/07/19  Yes Jaime FaceHayes, Keanthony Poole P, MD   naproxen sodium (ALEVE) 220 mg tablet Take 440 mg  by mouth two (2) times daily (with meals).   Yes Provider, Historical   aspirin delayed-release 81 mg tablet Take 81 mg by mouth daily.   Yes Provider, Historical   loratadine (CLARITIN PO) Take  by mouth as needed.  05/21/19  Provider, Historical   omeprazole (PRILOSEC) 40 mg capsule Take 1 Cap by mouth daily. 07/21/18 05/21/19  Jaime FaceHayes, Tredarius Cobern P, MD       Allergies   Allergen Reactions   ??? Latex, Natural Rubber Itching         Review of Systems   Constitutional: Negative for malaise/fatigue and weight loss.   Eyes:        Had cataract surgery since last visit, is now contemplating bilateral eye lid lift   Respiratory: Positive for shortness of breath (only with walking, not an issue w/ stairs and ADL'Bailey, present for a few years, not getting better or worse). Negative for cough.    Cardiovascular: Negative for chest pain, palpitations and leg swelling.   Gastrointestinal: Negative for abdominal pain, constipation, diarrhea, heartburn, nausea and vomiting.   Musculoskeletal: Negative for joint pain and myalgias.   Skin: Negative for rash.   Neurological: Negative for dizziness and headaches.   Psychiatric/Behavioral: Negative for depression. The patient is not nervous/anxious and does not have insomnia.           Objective:     General: alert, cooperative, no distress   Mental  status: normal mood, behavior, speech, dress, motor activity, and thought processes, able to follow commands   Eyes: normal sclera   Mouth:     Neck: no visualized mass   Resp: normal effort, no respiratory distress and talking in full sentences   Neuro: no gross deficits   Musculoskeletal:    Skin: no discoloration or lesions of concern on visible areas   Psychiatric: normal affect         We discussed the expected course, resolution and complications of the diagnosis(es) in detail.  Medication risks, benefits, costs, interactions, and alternatives were discussed as indicated.  I advised her to contact the office if her condition worsens, changes or fails to improve as anticipated. She expressed understanding with the diagnosis(es) and plan.     Jaime Facehristopher Bailey Totiana Everson, MD

## 2019-05-25 ENCOUNTER — Inpatient Hospital Stay: Admit: 2019-06-11 | Payer: MEDICARE

## 2019-05-25 ENCOUNTER — Inpatient Hospital Stay: Admit: 2019-05-25 | Payer: MEDICARE | Attending: Internal Medicine

## 2019-05-25 DIAGNOSIS — R0602 Shortness of breath: Secondary | ICD-10-CM

## 2019-05-25 DIAGNOSIS — E7800 Pure hypercholesterolemia, unspecified: Secondary | ICD-10-CM

## 2019-05-25 NOTE — Progress Notes (Signed)
Results reviewed.  Results released via MyChart.  Xray is normal.

## 2019-05-26 LAB — CBC
Hematocrit: 39.9 % (ref 34.0–46.6)
Hemoglobin: 13.6 g/dL (ref 11.1–15.9)
MCH: 29.9 pg (ref 26.6–33.0)
MCHC: 34.1 g/dL (ref 31.5–35.7)
MCV: 88 fL (ref 79–97)
Platelets: 232 10*3/uL (ref 150–450)
RBC: 4.55 x10E6/uL (ref 3.77–5.28)
RDW: 12.3 % (ref 11.7–15.4)
WBC: 5.1 10*3/uL (ref 3.4–10.8)

## 2019-05-26 LAB — TSH 3RD GENERATION
TSH: 2.68 u[IU]/mL (ref 0.450–4.500)
TSH: 2.68 u[IU]/mL (ref 0.450–4.500)

## 2019-05-26 LAB — LIPID PANEL
Cholesterol, Total: 188 mg/dL (ref 100–199)
Cholesterol, total: 188 mg/dL (ref 100–199)
HDL Cholesterol: 42 mg/dL (ref >39)
HDL: 42 mg/dL (ref >39)
LDL Calculated: 101 mg/dL — ABNORMAL HIGH (ref 0–99)
LDL, calculated: 101 mg/dL — ABNORMAL HIGH (ref 0–99)
Triglyceride: 226 mg/dL — ABNORMAL HIGH (ref 0–149)
Triglycerides: 226 mg/dL — ABNORMAL HIGH (ref 0–149)
VLDL Cholesterol Calculated: 45 mg/dL — ABNORMAL HIGH (ref 5–40)
VLDL, calculated: 45 mg/dL — ABNORMAL HIGH (ref 5–40)

## 2019-05-26 LAB — COMPREHENSIVE METABOLIC PANEL
ALT: 12 IU/L (ref 0–32)
AST: 16 IU/L (ref 0–40)
Albumin/Globulin Ratio: 1.9 NA (ref 1.2–2.2)
Albumin: 4.4 g/dL (ref 3.7–4.7)
Alkaline Phosphatase: 87 IU/L (ref 39–117)
BUN: 15 mg/dL (ref 8–27)
Bun/Cre Ratio: 24 NA (ref 12–28)
CO2: 23 mmol/L (ref 20–29)
Calcium: 10.3 mg/dL (ref 8.7–10.3)
Chloride: 106 mmol/L (ref 96–106)
Creatinine: 0.63 mg/dL (ref 0.57–1.00)
EGFR IF NonAfrican American: 87 mL/min/{1.73_m2} (ref >59)
GFR African American: 100 mL/min/{1.73_m2} (ref >59)
Globulin, Total: 2.3 g/dL (ref 1.5–4.5)
Glucose: 93 mg/dL (ref 65–99)
Potassium: 4.5 mmol/L (ref 3.5–5.2)
Sodium: 142 mmol/L (ref 134–144)
Total Bilirubin: 0.5 mg/dL (ref 0.0–1.2)
Total Protein: 6.7 g/dL (ref 6.0–8.5)

## 2019-05-26 LAB — METABOLIC PANEL, COMPREHENSIVE
A-G Ratio: 1.9 (ref 1.2–2.2)
ALT (SGPT): 12 IU/L (ref 0–32)
AST (SGOT): 16 IU/L (ref 0–40)
Albumin: 4.4 g/dL (ref 3.7–4.7)
Alk. phosphatase: 87 IU/L (ref 39–117)
BUN/Creatinine ratio: 24 (ref 12–28)
BUN: 15 mg/dL (ref 8–27)
Bilirubin, total: 0.5 mg/dL (ref 0.0–1.2)
CO2: 23 mmol/L (ref 20–29)
Calcium: 10.3 mg/dL (ref 8.7–10.3)
Chloride: 106 mmol/L (ref 96–106)
Creatinine: 0.63 mg/dL (ref 0.57–1.00)
GFR est AA: 100 mL/min/{1.73_m2} (ref >59)
GFR est non-AA: 87 mL/min/{1.73_m2} (ref >59)
GLOBULIN, TOTAL: 2.3 g/dL (ref 1.5–4.5)
Glucose: 93 mg/dL (ref 65–99)
Potassium: 4.5 mmol/L (ref 3.5–5.2)
Protein, total: 6.7 g/dL (ref 6.0–8.5)
Sodium: 142 mmol/L (ref 134–144)

## 2019-05-26 LAB — CBC W/O DIFF
HCT: 39.9 % (ref 34.0–46.6)
HGB: 13.6 g/dL (ref 11.1–15.9)
MCH: 29.9 pg (ref 26.6–33.0)
MCHC: 34.1 g/dL (ref 31.5–35.7)
MCV: 88 fL (ref 79–97)
PLATELET: 232 10*3/uL (ref 150–450)
RBC: 4.55 x10E6/uL (ref 3.77–5.28)
RDW: 12.3 % (ref 11.7–15.4)
WBC: 5.1 10*3/uL (ref 3.4–10.8)

## 2019-05-29 ENCOUNTER — Encounter

## 2019-07-07 ENCOUNTER — Encounter

## 2019-07-09 MED ORDER — SYNTHROID 100 MCG TABLET
100 mcg | ORAL_TABLET | ORAL | 1 refills | Status: DC
Start: 2019-07-09 — End: 2020-02-05

## 2019-07-26 ENCOUNTER — Encounter

## 2019-07-27 MED ORDER — ATORVASTATIN 10 MG TAB
10 mg | ORAL_TABLET | ORAL | 1 refills | Status: DC
Start: 2019-07-27 — End: 2020-02-04

## 2019-09-06 IMAGING — DX FOOT 3 VIEWS RIGHT
1 series · 3 of 3 positions shown · non-contrast
Comparison: None

FOOT 3 VIEWS RIGHT, 09/06/2019 [DATE]: 
CLINICAL INDICATION: 78-year-old female with ankle pain radiating towards the 
toes
TECHNIQUE: 3 views RIGHT foot were performed weightbearing

[Series 1: AP · U · 0.14mm/px · 3 of 3 slices shown]
[im 1/3]
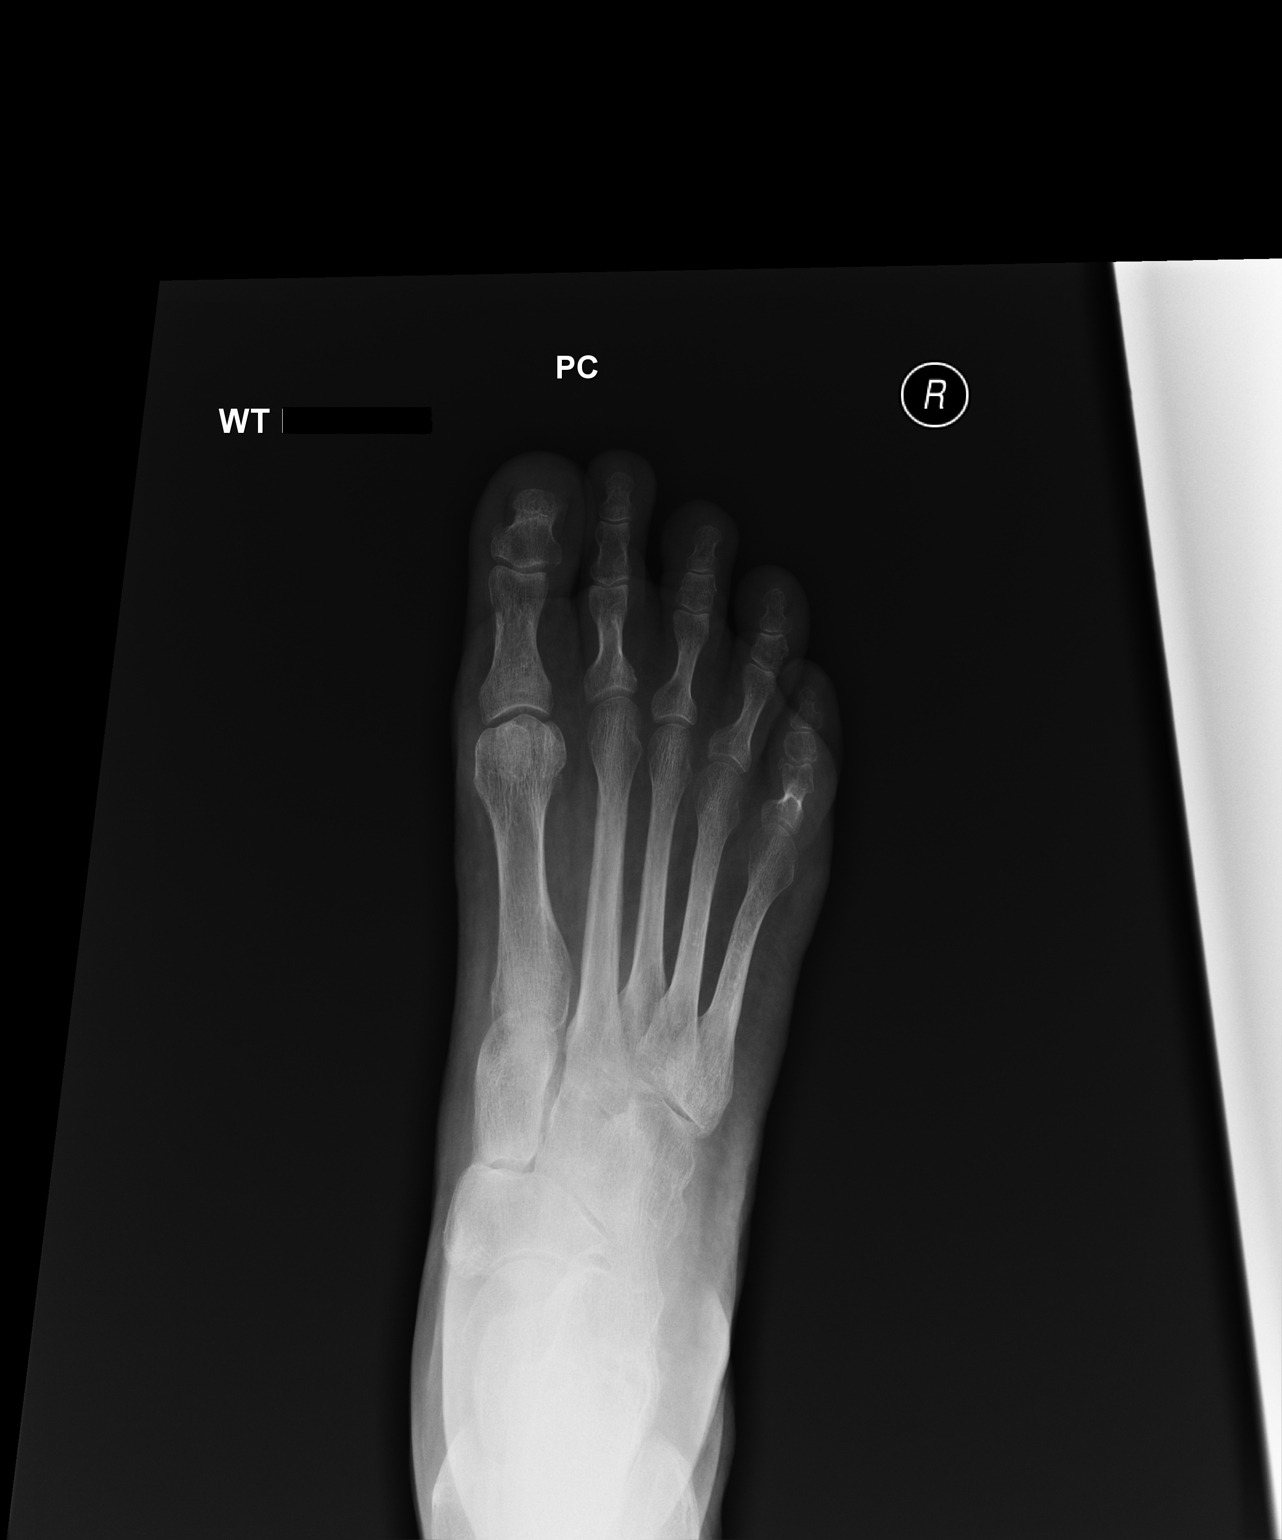
[im 2/3]
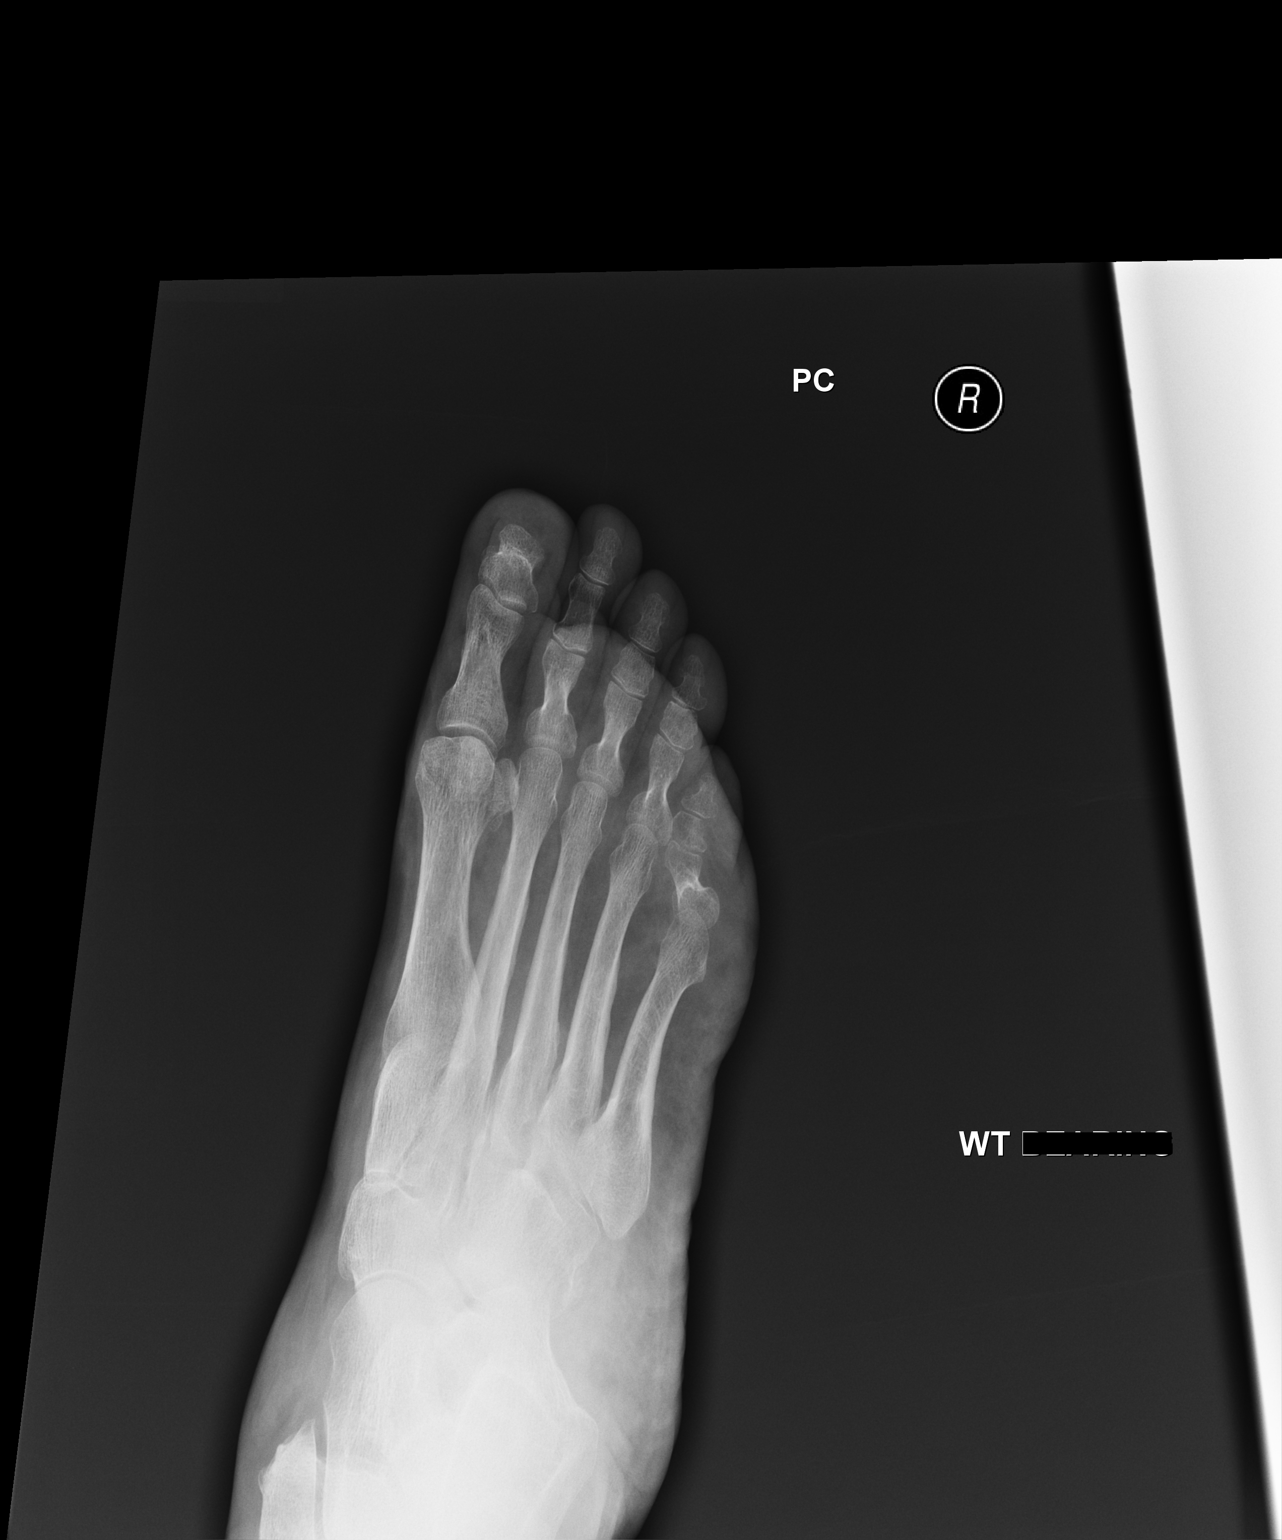
[im 3/3]
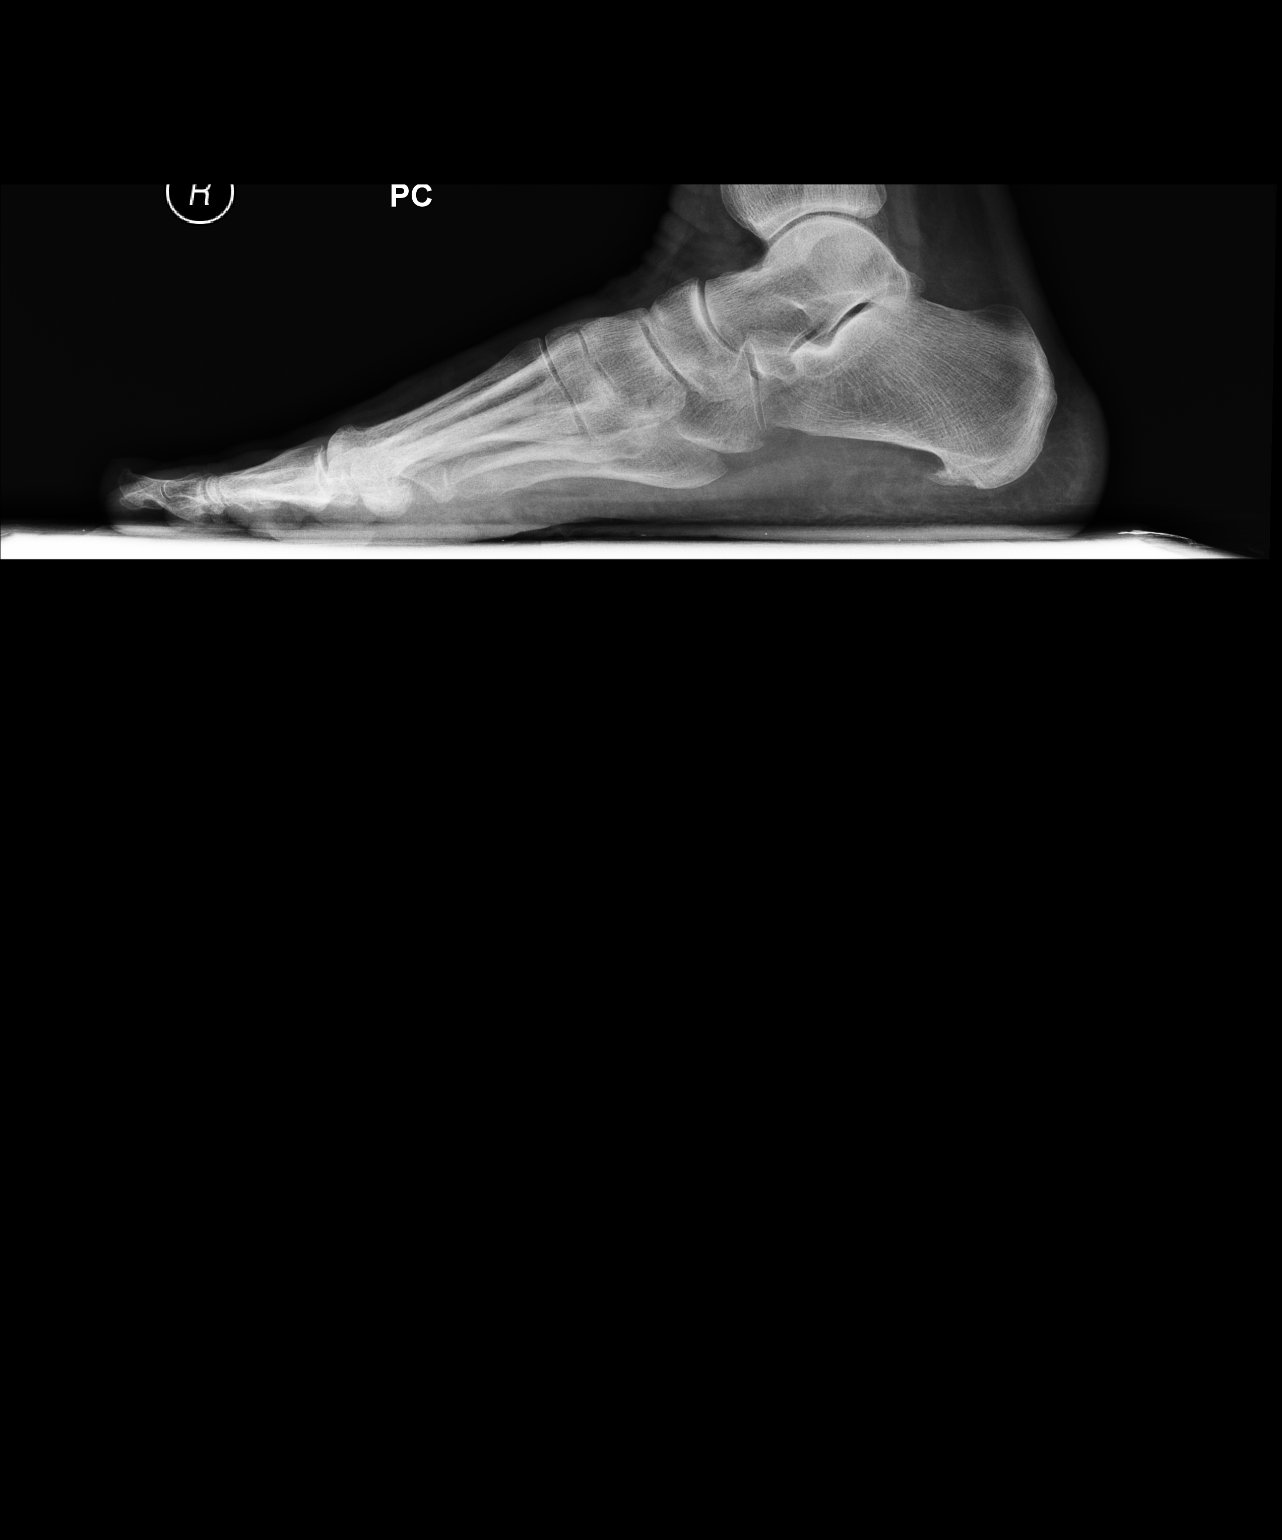

[3 of 3 positions shown; findings below may reference images not displayed]

FINDINGS: Mild osteopenia. Second metatarsal shaft is slightly sclerotic with cortical 
thickening, possibly physiologic or stress response. Lateral film demonstrates 
bridging osteophytes dorsal midfoot, location incompletely characterized. Mature 
plantar calcaneal spur. Preserved longitudinal arch. Mild dorsal metatarsal 
degenerative osteophyte.
IMPRESSION: Possible second metatarsal shaft stress response. Correlate with physical exam. 
Degenerative arthritis dorsal first metatarsal head and midfoot. Mature plantar 
calcaneal spur.

## 2019-09-13 NOTE — Telephone Encounter (Signed)
Lori with [facility] [phone]     Asked for a callback from the nurse.  Patient is having surgery in [place] and needs some information.

## 2019-09-13 NOTE — Telephone Encounter (Signed)
Left Message for a return phone call.

## 2019-09-13 NOTE — Telephone Encounter (Signed)
Lori from [facility] [employer] [facility] returned [employer].

## 2019-09-14 NOTE — Telephone Encounter (Signed)
RC to [place], [facility] returning her call.  Also, left fax number in case she needs to fax any information to [place].

## 2019-09-17 NOTE — Telephone Encounter (Signed)
Need to get release/permission from the patient as I am not familiar with them and pt has not told me she was getting surgery.  Verbal okay is fine.  Once she gives it then yes, okay to send Med List, Problem List, and PSH.

## 2019-09-17 NOTE — Telephone Encounter (Signed)
Attempted to reach patient via phone, unsucessful. Left message to return call. Need to get verbal okay to send notes, records to provider in [place], also need providers name, procedure.

## 2019-09-17 NOTE — Telephone Encounter (Signed)
Lawson Fiscal called back. Needs a medical history, with whatever meds patient is on.  Patient has told her she has no problems at all.  Please fax to following number:      Fax# [phone]

## 2019-09-19 NOTE — Telephone Encounter (Signed)
RC from patient, identified verified.  Dr. Doree Fudge, [facility], [place], [place].  Bilateral eye lift.  [phone].  Verbal okay given to provide information to Dr. Doree Fudge.  Will fax information per Dr. Madilyn Fireman.

## 2019-09-19 NOTE — Telephone Encounter (Signed)
Attempted to reach patient via phone, unsucessful. Left message to return call.

## 2019-09-19 NOTE — Telephone Encounter (Signed)
Not a clearance for surgery but just providing information requested.

## 2019-09-19 NOTE — Telephone Encounter (Signed)
Letter faxed to Dr. Doree Fudge with confirmation of receipt.

## 2019-09-21 NOTE — Telephone Encounter (Signed)
09/21/19 - 11am    Surgery scheduler called this afternoon.  She was inquiring about the medical clearance form that was faxed.    Informed I can't provide clearance as I have not physically seen her since since June '19 for a regular visit, August '19 for a sick visit, and June '20 for a virtual visit.    She just needed to know if she has a history of diabetes, heart disease, or breathing issues.  Notified no h/o DM or CAD/CHF.  She was having some SOB in June, w/u was normal, I have not heard back from her so I'm not sure if this is an issue.     she informed me based on this can proceed with surgery and does not need medical clearance.

## 2019-11-14 NOTE — Telephone Encounter (Signed)
Left message for patient to return call.  Pre-op needs to be within 30 days of surgery

## 2019-11-14 NOTE — Telephone Encounter (Addendum)
Jaime Bailey, Jaime Bailey (Self) [phone] (H)     Pt says that she is scheduled for surgery on 01/08/20 out of town, would like to come in and have a pre op done this week or the next for this surgery, after that she will be leaving town.

## 2019-12-12 ENCOUNTER — Encounter: Attending: Internal Medicine

## 2019-12-25 ENCOUNTER — Ambulatory Visit

## 2019-12-25 ENCOUNTER — Telehealth: Attending: Family

## 2019-12-25 ENCOUNTER — Telehealth: Admit: 2019-12-25 | Discharge: 2019-12-25 | Payer: MEDICARE | Attending: Family

## 2019-12-25 ENCOUNTER — Ambulatory Visit: Admit: 2019-12-25 | Payer: MEDICARE

## 2019-12-25 DIAGNOSIS — R059 Cough, unspecified: Secondary | ICD-10-CM

## 2019-12-25 DIAGNOSIS — J029 Acute pharyngitis, unspecified: Secondary | ICD-10-CM

## 2019-12-25 LAB — AMB POC RAPID STREP A
Group A Strep Ag: NEGATIVE
Group A Strep Antigen, POC: NEGATIVE

## 2019-12-25 MED ORDER — PREDNISONE 5 MG TABLETS IN A DOSE PACK
5 mg | ORAL_TABLET | ORAL | 0 refills | Status: DC
Start: 2019-12-25 — End: 2020-01-09

## 2019-12-25 MED ORDER — DOXYCYCLINE 100 MG TAB
100 mg | ORAL_TABLET | Freq: Two times a day (BID) | ORAL | 0 refills | Status: AC
Start: 2019-12-25 — End: 2020-01-04

## 2019-12-25 MED ORDER — BENZONATATE 200 MG CAP
200 mg | ORAL_CAPSULE | Freq: Three times a day (TID) | ORAL | 0 refills | Status: AC | PRN
Start: 2019-12-25 — End: ?

## 2019-12-25 NOTE — Progress Notes (Signed)
This patient was seen at [name] while in their vehicle due to COVID-19 pandemic with PPE and focused examination in order to decrease community viral transmission.     The patient/guardian gave verbal consent to treat.    Jaime Bailey is a 79 y.o. female who presents with cough, chest tightness, ST x 4-5 days. No known COVID-19 contacts. Denies fever, SOB.       The history is provided by the patient.        Past Medical History:   Diagnosis Date   ??? Arthritis     finger   ??? Concussion 2002   ??? Diverticulosis    ??? Hyperlipidemia    ??? Hypothyroid    ??? Kidney stone     x1   ??? Knee pain, left 10/18/2014    MRI (11/15)- meniscus tear, OA, and baker's cyst    ??? Menopause     LMP-79 years old?   ??? Obesity    ??? 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 HX CATARACT REMOVAL Bilateral 09/2018   ??? HX COLONOSCOPY  1998    Colonic polyp removal at time of colonoscopy   ??? HX COLONOSCOPY  07/06/13    normal   ??? HX COLONOSCOPY  05/30/2018    diverticulosis; int hemorrhoids; o/w normal   ??? HX ENDOSCOPY  05/30/2018    nl esophagus & stomach; polyp x1 duod   ??? HX OTHER SURGICAL  09/2016    had precancerous lesion removed above left eye   ??? HX TAH AND BSO  1999         Family History   Problem Relation Age of Onset   ??? Cancer Mother         GBM   ??? COPD Father    ??? Heart Disease Father         ASCVD   ??? Other Brother         AAA   ??? Diabetes Brother    ??? Cancer Brother         bladder   ??? Lung Cancer Brother    ??? No Known Problems Daughter    ??? No Known Problems Son    ??? No Known Problems Son         Social History     Socioeconomic History   ??? Marital status: MARRIED     Spouse name: Not on file   ??? Number of children: Not on file   ??? Years of education: Not on file   ??? Highest education level: Not on file   Occupational History   ??? Not on file    Social Needs   ??? Financial resource strain: Not on file   ??? Food insecurity     Worry: Not on file     Inability: Not on file   ??? Transportation needs     Medical: Not on file     Non-medical: Not on file   Tobacco Use   ??? Smoking status: Former Smoker     Packs/day: 1.00     Years: 10.00     Pack years: 10.00     Quit date: 05/04/1977     Years since quitting: 42.6   ??? Smokeless tobacco: Never Used   ???  Tobacco comment: former cigarette smoker   Substance and Sexual Activity   ??? Alcohol use: Yes     Alcohol/week: 0.8 standard drinks     Types: 1 Glasses of wine per week     Frequency: 2-4 times a month     Drinks per session: 1 or 2     Binge frequency: Never   ??? Drug use: No   ??? Sexual activity: Not Currently     Partners: Male   Lifestyle   ??? Physical activity     Days per week: Not on file     Minutes per session: Not on file   ??? Stress: Not on file   Relationships   ??? Social Product manager on phone: Not on file     Gets together: Not on file     Attends religious service: Not on file     Active member of club or organization: Not on file     Attends meetings of clubs or organizations: Not on file     Relationship status: Not on file   ??? Intimate partner violence     Fear of current or ex partner: Not on file     Emotionally abused: Not on file     Physically abused: Not on file     Forced sexual activity: Not on file   Other Topics Concern   ??? Not on file   Social History Narrative   ??? Not on file                ALLERGIES: Latex, natural rubber    Review of Systems   Constitutional: Negative for activity change, appetite change, chills and fever.   HENT: Positive for congestion, postnasal drip and sore throat. Negative for rhinorrhea.    Respiratory: Positive for cough and chest tightness. Negative for shortness of breath and wheezing.    Cardiovascular: Negative for chest pain.   Gastrointestinal: Negative for abdominal pain, diarrhea, nausea and vomiting.   Musculoskeletal: Negative for myalgias.    Neurological: Negative for headaches.       Vitals:    12/25/19 1240   Pulse: (!) 57   Resp: 16   Temp: 98.2 ??F (36.8 ??C)   SpO2: 95%       Physical Exam  Vitals signs and nursing note reviewed.   Constitutional:       General: She is not in acute distress.     Appearance: She is well-developed. She is not diaphoretic.   HENT:      Mouth/Throat:      Mouth: Mucous membranes are moist.      Pharynx: Oropharynx is clear. Posterior oropharyngeal erythema present. No oropharyngeal exudate.   Pulmonary:      Effort: Pulmonary effort is normal. No respiratory distress.      Breath sounds: Normal breath sounds. No stridor. No wheezing, rhonchi or rales.   Neurological:      Mental Status: She is alert.   Psychiatric:         Behavior: Behavior normal.         Thought Content: Thought content normal.         Judgment: Judgment normal.         MDM    ICD-10-CM ICD-9-CM   1. Sore throat  J02.9 462   2. Cough  R05 786.2   3. Screening for viral disease  Z11.59 V73.99       Orders Placed This Encounter   ???  NOVEL CORONAVIRUS (COVID-19)     Scheduling Instructions:      1) Due to current limited availability of the COVID-19 PCR test, tests will be prioritized and may not be completed.????            2) Order only if the test result will change clinical management or necessary for a return to mission-critical employment decision.????            3) Print and instruct patient to adhere to CDC home isolation program. (Link Above)????            4) Set up or refer patient for a monitoring program.????            5) Have patient sign up for and leverage MyChart (if not previously done).     Order Specific Question:   Is this test for diagnosis or screening?     Answer:   Diagnosis of ill patient     Order Specific Question:   Symptomatic for COVID-19 as defined by CDC?     Answer:   Yes     Order Specific Question:   Date of Symptom Onset     Answer:   12/21/2019     Order Specific Question:   Hospitalized for COVID-19?     Answer:   No      Order Specific Question:   Admitted to ICU for COVID-19?     Answer:   No     Order Specific Question:   Employed in healthcare setting?     Answer:   No     Order Specific Question:   Resident in a congregate (group) care setting?     Answer:   No     Order Specific Question:   Pregnant?     Answer:   No     Order Specific Question:   Previously tested for COVID-19?     Answer:   No   ??? AMB POC RAPID STREP A   ??? doxycycline (ADOXA) 100 mg tablet     Sig: Take 1 Tab by mouth two (2) times a day for 10 days.     Dispense:  20 Tab     Refill:  0   ??? benzonatate (TESSALON) 200 mg capsule     Sig: Take 1 Cap by mouth three (3) times daily as needed for Cough.     Dispense:  30 Cap     Refill:  0   ??? predniSONE (STERAPRED) 5 mg dose pack     Sig: See administration instruction per 5mg  dose pack     Dispense:  21 Tab     Refill:  0        Quarantine  Deep breathing exercises, ambulation      If signs and symptoms become worse the pt is to go to the ER.     Results for orders placed or performed in visit on 12/25/19   AMB POC RAPID STREP A   Result Value Ref Range    VALID INTERNAL CONTROL POC Yes     Group A Strep Ag Negative Negative       Procedures

## 2019-12-25 NOTE — Progress Notes (Signed)
Jaime Bailey is a 79 y.o. female evaluated via telephone on 12/25/2019.  Patient unable to connect via audio-video.      Assessment & Plan:   Diagnoses and all orders for this visit:    1. Cough    2. Sore throat    3. Hoarseness    report to [facility] or other urgent care for covid testing  Quarantine until results received    Subjective:    I communicated with the patient and/or health care decision maker today regarding: dry cough, chest tightness, sore throat, post-nasal drip, hoarseness x 3 days. No fever. No known covid exposure. She is not taking anything for symptoms.      The following sections were reviewed and/or updated:  Patient Active Problem List   Diagnosis Code   ??? Hypothyroid E03.9   ??? Hyperlipidemia E78.5   ??? Obesity (BMI 30.0-34.9) E66.9   ??? 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 Age-related osteoporosis without current pathological fracture 12/28/2016   ??? 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 Obesity (BMI 30.0-34.9) 10/09/2013   ??? Hypothyroid    ??? Hyperlipidemia      Current Outpatient Medications   Medication Sig Dispense Refill   ??? atorvastatin (LIPITOR) 10 mg tablet TAKE 1 TABLET BY MOUTH DAILY 90 Tab 1   ??? Synthroid 100 mcg tablet TAKE 1 TABLET BY MOUTH EVERY DAY. NEED TO BE SEEN 90 Tab 1   ??? naproxen sodium (ALEVE) 220 mg tablet Take 440 mg by mouth two (2) times daily (with meals).     ??? aspirin delayed-release 81 mg tablet Take 81 mg by mouth daily.       Allergies   Allergen Reactions   ??? Latex, Natural Rubber Itching     Past Medical History:   Diagnosis Date   ??? Arthritis     finger   ??? Concussion 2002   ??? Diverticulosis    ??? Hyperlipidemia    ??? Hypothyroid    ??? Kidney stone     x1   ??? Knee pain, left 10/18/2014    MRI (11/15)- meniscus tear, OA,  and baker's cyst    ??? Menopause     LMP-79 years old?   ??? Obesity    ??? 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 HX CATARACT REMOVAL Bilateral 09/2018   ??? HX COLONOSCOPY  1998    Colonic polyp removal at time of colonoscopy   ??? HX COLONOSCOPY  07/06/13    normal   ??? HX COLONOSCOPY  05/30/2018    diverticulosis; int hemorrhoids; o/w normal   ??? HX ENDOSCOPY  05/30/2018    nl esophagus & stomach; polyp x1 duod   ??? HX OTHER SURGICAL  09/2016    had precancerous lesion removed above left eye   ??? HX TAH AND BSO  1999     Family History   Problem Relation Age of Onset   ??? Cancer Mother         GBM   ???  COPD Father    ??? Heart Disease Father         ASCVD   ??? Other Brother         AAA   ??? Diabetes Brother    ??? Cancer Brother         bladder   ??? Lung Cancer Brother    ??? No Known Problems Daughter    ??? No Known Problems Son    ??? No Known Problems Son      Social History     Tobacco Use   ??? Smoking status: Former Smoker     Packs/day: 1.00     Years: 10.00     Pack years: 10.00     Quit date: 05/04/1977     Years since quitting: 42.6   ??? Smokeless tobacco: Never Used   ??? Tobacco comment: former cigarette smoker   Substance Use Topics   ??? Alcohol use: Yes     Alcohol/week: 0.8 standard drinks     Types: 1 Glasses of wine per week     Frequency: 2-4 times a month     Drinks per session: 1 or 2     Binge frequency: Never         Consent:  Jaime Bailey, who was seen by synchronous (real-time) audio only technology, and/or her healthcare decision maker, is aware that this patient-initiated, Telehealth encounter on 12/25/2019 is a billable service. She is aware that she may receive a bill for any such additional services and has provided verbal consent to proceed: Yes.    Patient identification was verified prior to start of the visit. A caregiver was present when appropriate.  Due to this being a Scientist, physiological (during PPIRJ-18 public health emergency), evaluation of the following organ systems was limited: VS/Constitutional/EENT/Resp/CV/GI/GU/MS/Neuro/Skin/Heme-Lymph-Imm.  Pursuant to the emergency declaration under the [place], 1135 waiver authority and the [employer] Act, this Virtual Visit was conducted, with patient's (and/or legal guardian's) consent, to reduce the patient's risk of exposure to COVID-19 and provide necessary medical care.     Services were provided through a synchronous discussion virtually to substitute for in-person clinic visit. I was in the office. The patient was at home.    I affirm this is a Patient Initiated Episode with an Established Patient who has not had a related appointment within my department in the past 7 days or scheduled within the next 24 hours.    Total Time: minutes: 11-20 minutes    Note: not billable if this call serves to triage the patient into an appointment for the relevant concern    Jodean Lima, NP

## 2019-12-25 NOTE — Progress Notes (Signed)
Jaime Bailey is a 79 y.o. female evaluated via telephone on 12/25/2019.  Patient unable to connect via audio-video.      Assessment & Plan:   Diagnoses and all orders for this visit:    1. Cough    2. Sore throat    3. Hoarseness    report to [facility] or other urgent care for covid testing  Quarantine until results received    Subjective:    I communicated with the patient and/or health care decision maker today regarding: dry cough, chest tightness, sore throat, post-nasal drip, hoarseness x 3 days. No fever. No known covid exposure. She is not taking anything for symptoms.      The following sections were reviewed and/or updated:  Patient Active Problem List   Diagnosis Code   ??? Hypothyroid E03.9   ??? Hyperlipidemia E78.5   ??? Obesity (BMI 30.0-34.9) E66.9   ??? 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 Age-related osteoporosis without current pathological fracture 12/28/2016   ??? 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 Obesity (BMI 30.0-34.9) 10/09/2013   ??? Hypothyroid    ??? Hyperlipidemia      Current Outpatient Medications   Medication Sig Dispense Refill   ??? atorvastatin (LIPITOR) 10 mg tablet TAKE 1 TABLET BY MOUTH DAILY 90 Tab 1   ??? Synthroid 100 mcg tablet TAKE 1 TABLET BY MOUTH EVERY DAY. NEED TO BE SEEN 90 Tab 1   ??? naproxen sodium (ALEVE) 220 mg tablet Take 440 mg by mouth two (2) times daily (with meals).     ??? aspirin delayed-release 81 mg tablet Take 81 mg by mouth daily.       Allergies   Allergen Reactions   ??? Latex, Natural Rubber Itching     Past Medical History:   Diagnosis Date   ??? Arthritis     finger   ??? Concussion 2002   ??? Diverticulosis    ??? Hyperlipidemia    ??? Hypothyroid    ??? Kidney stone     x1   ??? Knee pain, left 10/18/2014     MRI (11/15)- meniscus tear, OA, and baker's cyst    ??? Menopause     LMP-78 years old?   ??? Obesity    ??? 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 HX CATARACT REMOVAL Bilateral 09/2018   ??? HX COLONOSCOPY  1998    Colonic polyp removal at time of colonoscopy   ??? HX COLONOSCOPY  07/06/13    normal   ??? HX COLONOSCOPY  05/30/2018    diverticulosis; int hemorrhoids; o/w normal   ??? HX ENDOSCOPY  05/30/2018    nl esophagus & stomach; polyp x1 duod   ??? HX OTHER SURGICAL  09/2016    had precancerous lesion removed above left eye   ??? HX TAH AND BSO  1999     Family History   Problem Relation Age of Onset   ??? Cancer Mother         GBM   ???  COPD Father    ??? Heart Disease Father         ASCVD   ??? Other Brother         AAA   ??? Diabetes Brother    ??? Cancer Brother         bladder   ??? Lung Cancer Brother    ??? No Known Problems Daughter    ??? No Known Problems Son    ??? No Known Problems Son      Social History     Tobacco Use   ??? Smoking status: Former Smoker     Packs/day: 1.00     Years: 10.00     Pack years: 10.00     Quit date: 05/04/1977     Years since quitting: 42.6   ??? Smokeless tobacco: Never Used   ??? Tobacco comment: former cigarette smoker   Substance Use Topics   ??? Alcohol use: Yes     Alcohol/week: 0.8 standard drinks     Types: 1 Glasses of wine per week     Frequency: 2-4 times a month     Drinks per session: 1 or 2     Binge frequency: Never         Consent:  Jaime Bailey, who was seen by synchronous (real-time) audio only technology, and/or her healthcare decision maker, is aware that this patient-initiated, Telehealth encounter on 12/25/2019 is a billable service. She is aware that she may receive a bill for any such additional services and has provided verbal consent to proceed: Yes.     Patient identification was verified prior to start of the visit. A caregiver was present when appropriate. Due to this being a Scientist, physiological (during NGEXB-28 public health emergency), evaluation of the following organ systems was limited: VS/Constitutional/EENT/Resp/CV/GI/GU/MS/Neuro/Skin/Heme-Lymph-Imm.  Pursuant to the emergency declaration under the [place], 1135 waiver authority and the [employer] Act, this Virtual Visit was conducted, with patient's (and/or legal guardian's) consent, to reduce the patient's risk of exposure to COVID-19 and provide necessary medical care.     Services were provided through a synchronous discussion virtually to substitute for in-person clinic visit. I was in the office. The patient was at home.    I affirm this is a Patient Initiated Episode with an Established Patient who has not had a related appointment within my department in the past 7 days or scheduled within the next 24 hours.    Total Time: minutes: 11-20 minutes    Note: not billable if this call serves to triage the patient into an appointment for the relevant concern    Jodean Lima, NP

## 2019-12-25 NOTE — Progress Notes (Signed)
This patient was seen at [name] while in their vehicle due to COVID-19 pandemic with PPE and focused examination in order to decrease community viral transmission.     The patient/guardian gave verbal consent to treat.    Jaime Bailey is a 79 y.o. female who presents with cough, chest tightness, ST x 4-5 days. No known COVID-19 contacts. Denies fever, SOB.       The history is provided by the patient.        Past Medical History:   Diagnosis Date   ??? Arthritis     finger   ??? Concussion 2002   ??? Diverticulosis    ??? Hyperlipidemia    ??? Hypothyroid    ??? Kidney stone     x1   ??? Knee pain, left 10/18/2014    MRI (11/15)- meniscus tear, OA, and baker's cyst    ??? Menopause     LMP-79 years old?   ??? Obesity    ??? 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 HX CATARACT REMOVAL Bilateral 09/2018   ??? HX COLONOSCOPY  1998    Colonic polyp removal at time of colonoscopy   ??? HX COLONOSCOPY  07/06/13    normal   ??? HX COLONOSCOPY  05/30/2018    diverticulosis; int hemorrhoids; o/w normal   ??? HX ENDOSCOPY  05/30/2018    nl esophagus & stomach; polyp x1 duod   ??? HX OTHER SURGICAL  09/2016    had precancerous lesion removed above left eye   ??? HX TAH AND BSO  1999         Family History   Problem Relation Age of Onset   ??? Cancer Mother         GBM   ??? COPD Father    ??? Heart Disease Father         ASCVD   ??? Other Brother         AAA   ??? Diabetes Brother    ??? Cancer Brother         bladder   ??? Lung Cancer Brother    ??? No Known Problems Daughter    ??? No Known Problems Son    ??? No Known Problems Son         Social History     Socioeconomic History   ??? Marital status: MARRIED     Spouse name: Not on file   ??? Number of children: Not on file   ??? Years of education: Not on file   ??? Highest education level: Not on file   Occupational History   ??? Not on file    Social Needs   ??? Financial resource strain: Not on file   ??? Food insecurity     Worry: Not on file     Inability: Not on file   ??? Transportation needs     Medical: Not on file     Non-medical: Not on file   Tobacco Use   ??? Smoking status: Former Smoker     Packs/day: 1.00     Years: 10.00     Pack years: 10.00     Quit date: 05/04/1977     Years since quitting: 42.6   ??? Smokeless tobacco: Never Used   ???  Tobacco comment: former cigarette smoker   Substance and Sexual Activity   ??? Alcohol use: Yes     Alcohol/week: 0.8 standard drinks     Types: 1 Glasses of wine per week     Frequency: 2-4 times a month     Drinks per session: 1 or 2     Binge frequency: Never   ??? Drug use: No   ??? Sexual activity: Not Currently     Partners: Male   Lifestyle   ??? Physical activity     Days per week: Not on file     Minutes per session: Not on file   ??? Stress: Not on file   Relationships   ??? Social Product manager on phone: Not on file     Gets together: Not on file     Attends religious service: Not on file     Active member of club or organization: Not on file     Attends meetings of clubs or organizations: Not on file     Relationship status: Not on file   ??? Intimate partner violence     Fear of current or ex partner: Not on file     Emotionally abused: Not on file     Physically abused: Not on file     Forced sexual activity: Not on file   Other Topics Concern   ??? Not on file   Social History Narrative   ??? Not on file                ALLERGIES: Latex, natural rubber    Review of Systems   Constitutional: Negative for activity change, appetite change, chills and fever.   HENT: Positive for congestion, postnasal drip and sore throat. Negative for rhinorrhea.    Respiratory: Positive for cough and chest tightness. Negative for shortness of breath and wheezing.    Cardiovascular: Negative for chest pain.   Gastrointestinal: Negative for abdominal pain, diarrhea, nausea and vomiting.   Musculoskeletal: Negative for myalgias.    Neurological: Negative for headaches.       Vitals:    12/25/19 1240   Pulse: (!) 57   Resp: 16   Temp: 98.2 ??F (36.8 ??C)   SpO2: 95%       Physical Exam  Vitals signs and nursing note reviewed.   Constitutional:       General: She is not in acute distress.     Appearance: She is well-developed. She is not diaphoretic.   HENT:      Mouth/Throat:      Mouth: Mucous membranes are moist.      Pharynx: Oropharynx is clear. Posterior oropharyngeal erythema present. No oropharyngeal exudate.   Pulmonary:      Effort: Pulmonary effort is normal. No respiratory distress.      Breath sounds: Normal breath sounds. No stridor. No wheezing, rhonchi or rales.   Neurological:      Mental Status: She is alert.   Psychiatric:         Behavior: Behavior normal.         Thought Content: Thought content normal.         Judgment: Judgment normal.         MDM    ICD-10-CM ICD-9-CM   1. Sore throat  J02.9 462   2. Cough  R05 786.2   3. Screening for viral disease  Z11.59 V73.99       Orders Placed This Encounter   ???  NOVEL CORONAVIRUS (COVID-19)     Scheduling Instructions:      1) Due to current limited availability of the COVID-19 PCR test, tests will be prioritized and may not be completed.????            2) Order only if the test result will change clinical management or necessary for a return to mission-critical employment decision.????            3) Print and instruct patient to adhere to CDC home isolation program. (Link Above)????            4) Set up or refer patient for a monitoring program.????            5) Have patient sign up for and leverage MyChart (if not previously done).     Order Specific Question:   Is this test for diagnosis or screening?     Answer:   Diagnosis of ill patient     Order Specific Question:   Symptomatic for COVID-19 as defined by CDC?     Answer:   Yes     Order Specific Question:   Date of Symptom Onset     Answer:   12/21/2019     Order Specific Question:   Hospitalized for COVID-19?     Answer:   No      Order Specific Question:   Admitted to ICU for COVID-19?     Answer:   No     Order Specific Question:   Employed in healthcare setting?     Answer:   No     Order Specific Question:   Resident in a congregate (group) care setting?     Answer:   No     Order Specific Question:   Pregnant?     Answer:   No     Order Specific Question:   Previously tested for COVID-19?     Answer:   No   ??? AMB POC RAPID STREP A   ??? doxycycline (ADOXA) 100 mg tablet     Sig: Take 1 Tab by mouth two (2) times a day for 10 days.     Dispense:  20 Tab     Refill:  0   ??? benzonatate (TESSALON) 200 mg capsule     Sig: Take 1 Cap by mouth three (3) times daily as needed for Cough.     Dispense:  30 Cap     Refill:  0   ??? predniSONE (STERAPRED) 5 mg dose pack     Sig: See administration instruction per 5mg dose pack     Dispense:  21 Tab     Refill:  0        Quarantine  Deep breathing exercises, ambulation      If signs and symptoms become worse the pt is to go to the ER.     Results for orders placed or performed in visit on 12/25/19   AMB POC RAPID STREP A   Result Value Ref Range    VALID INTERNAL CONTROL POC Yes     Group A Strep Ag Negative Negative       Procedures

## 2019-12-27 LAB — COVID-19: SARS-CoV-2, NAA: NOT DETECTED

## 2019-12-27 LAB — NOVEL CORONAVIRUS (COVID-19): SARS-CoV-2, NAA: NOT DETECTED

## 2020-01-09 NOTE — Telephone Encounter (Signed)
See MyChart message

## 2020-01-10 MED ORDER — AZITHROMYCIN 250 MG TAB
250 mg | ORAL_TABLET | ORAL | 0 refills | Status: AC
Start: 2020-01-10 — End: 2020-01-14

## 2020-02-04 ENCOUNTER — Encounter

## 2020-02-04 MED ORDER — ATORVASTATIN 10 MG TAB
10 mg | ORAL_TABLET | ORAL | 0 refills | Status: DC
Start: 2020-02-04 — End: 2020-05-13

## 2020-02-06 MED ORDER — SYNTHROID 100 MCG TABLET
100 mcg | ORAL_TABLET | ORAL | 0 refills | Status: AC
Start: 2020-02-06 — End: ?

## 2020-05-13 ENCOUNTER — Encounter

## 2020-05-13 MED ORDER — ATORVASTATIN 10 MG TAB
10 mg | ORAL_TABLET | ORAL | 0 refills | Status: AC
Start: 2020-05-13 — End: ?

## 2020-05-13 NOTE — Telephone Encounter (Signed)
Due for yearly check up this month, nothing scheduled

## 2020-05-13 NOTE — Telephone Encounter (Signed)
 Requested Prescriptions     Pending Prescriptions Disp Refills   . atorvastatin (LIPITOR) 10 mg tablet 90 Tablet 0     [id number] - [place], [place] - [address] AT [facility] OF [facility] [address]  [phone]

## 2020-05-13 NOTE — Telephone Encounter (Signed)
Due for yearly check up this month, nothing scheduled

## 2020-05-13 NOTE — Telephone Encounter (Signed)
Requested Prescriptions     Pending Prescriptions Disp Refills   ??? atorvastatin (LIPITOR) 10 mg tablet 90 Tablet 0     [id number] - [place], [place] - [address] AT [address] OF [address]????[phone]

## 2020-05-19 NOTE — Telephone Encounter (Signed)
Called to schedule appointment for yearly appointment, no answer. Left message asking for call back.

## 2020-05-20 NOTE — Telephone Encounter (Signed)
Patient called back. Spoke to [place].  They moved to [facility].

## 2020-07-03 ENCOUNTER — Encounter

## 2020-08-02 ENCOUNTER — Encounter

## 2020-10-17 IMAGING — MG MAMMOGRAPHY SCREENING BILATERAL 3D TOMOSYNTHESIS WITH CAD
8 series · 8 of 24 positions shown · non-contrast
Comparison: Comparison is made to the prior exams dating back to 9762 
BREAST DENSITY: (Level B) There are scattered areas of fibroglandular density.

MAMMOGRAPHY SCREENING BILATERAL 3D TOMOSYNTHESIS WITH CAD, 10/17/2020 [DATE]: 
CLINICAL INDICATION: Screening
TECHNIQUE: Digital bilateral mammograms and 3-D Tomosynthesis were obtained. 
These were interpreted both primarily and with the aid of computer-aided 
detection system.

[L CC]
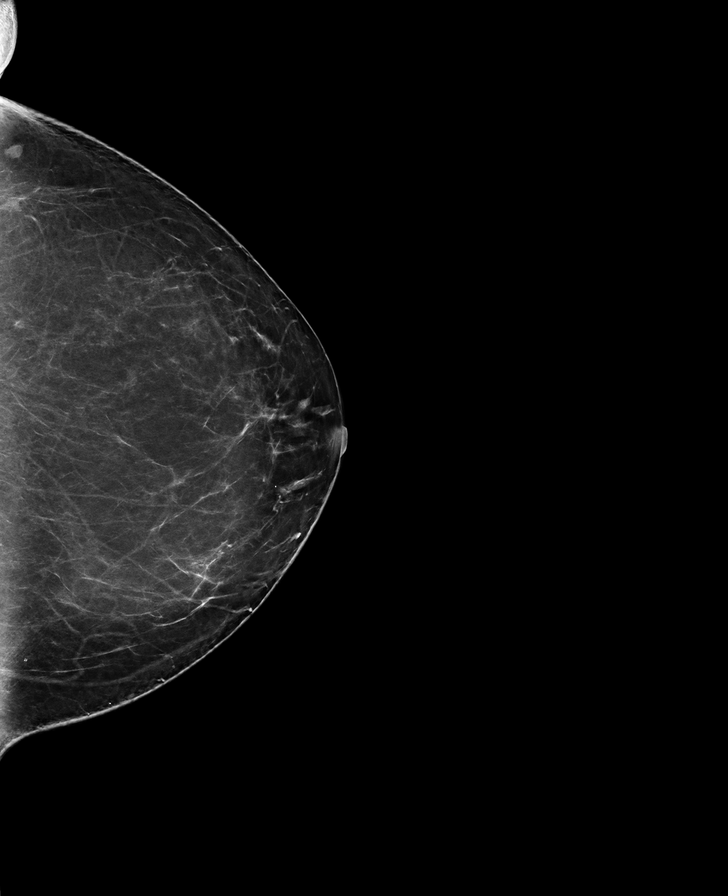

[R MLO]
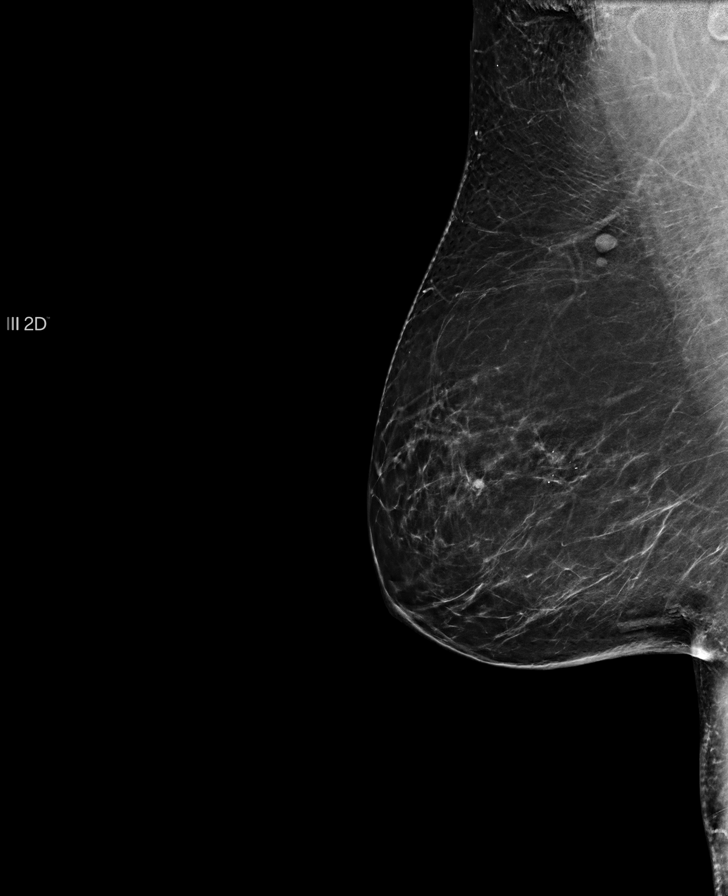

[L MLO]
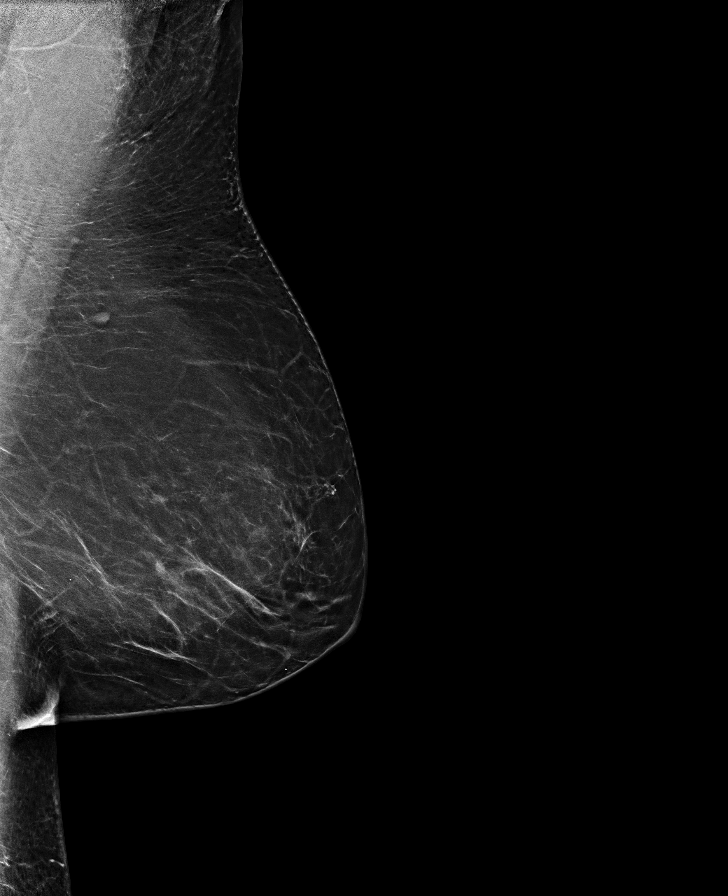

[R CC]
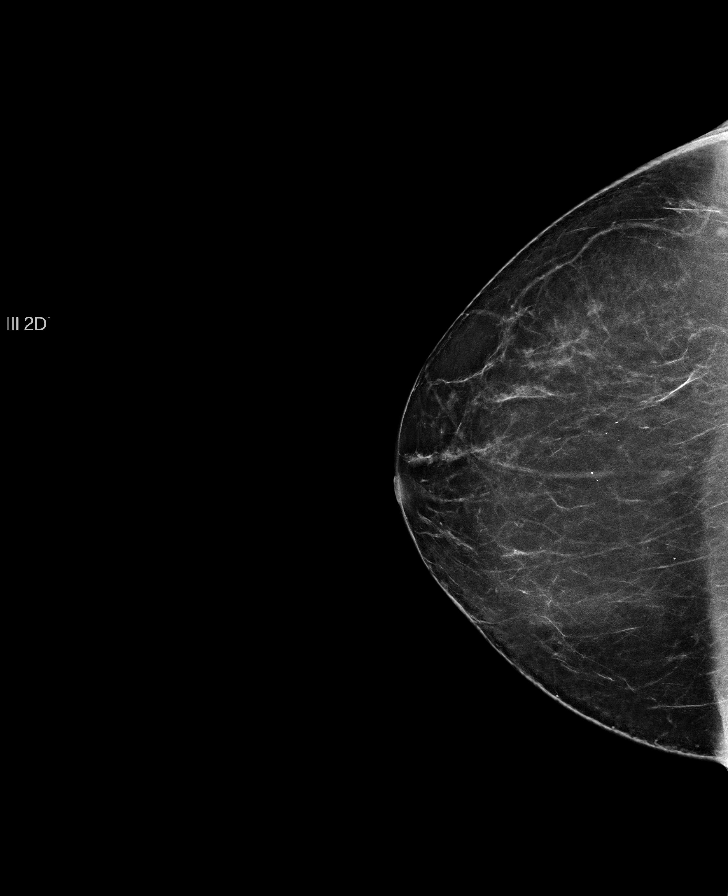

[L CC tomo · tomo slice 40/79.0]
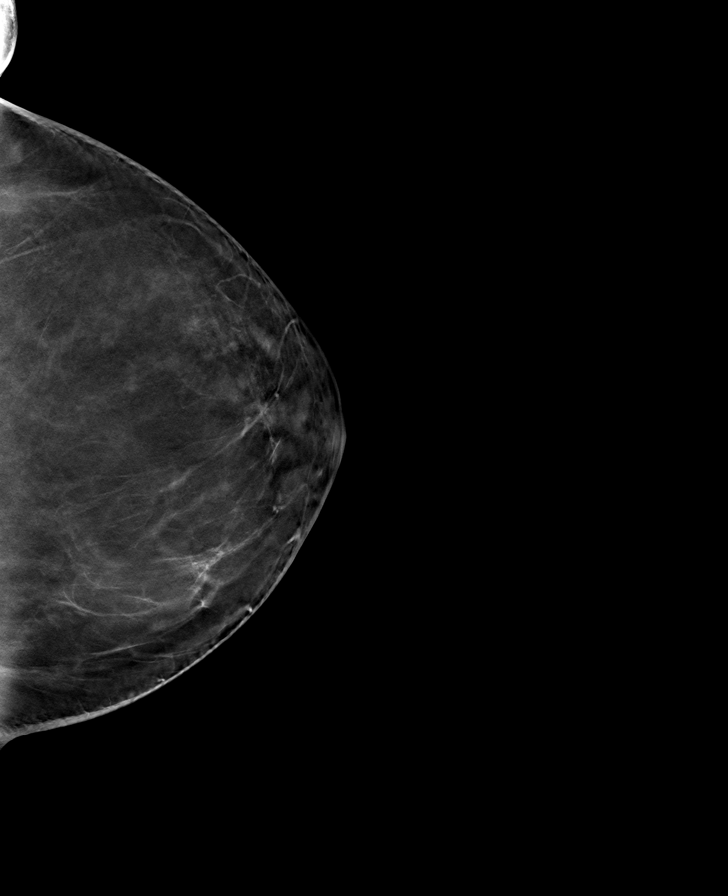

[R CC tomo · tomo slice 39/78.0]
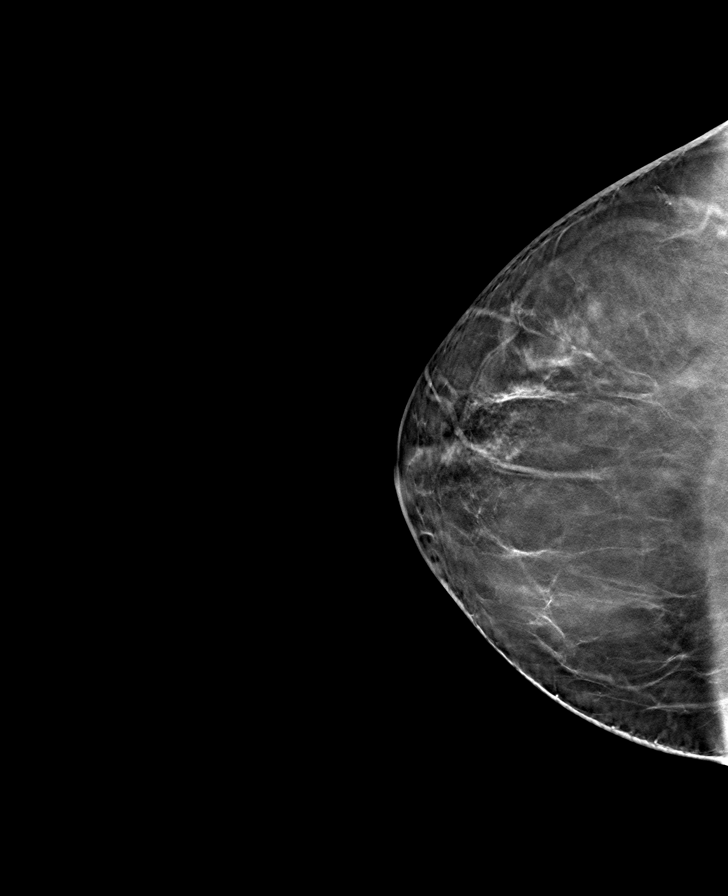

[R MLO tomo · tomo slice 41/80.0]
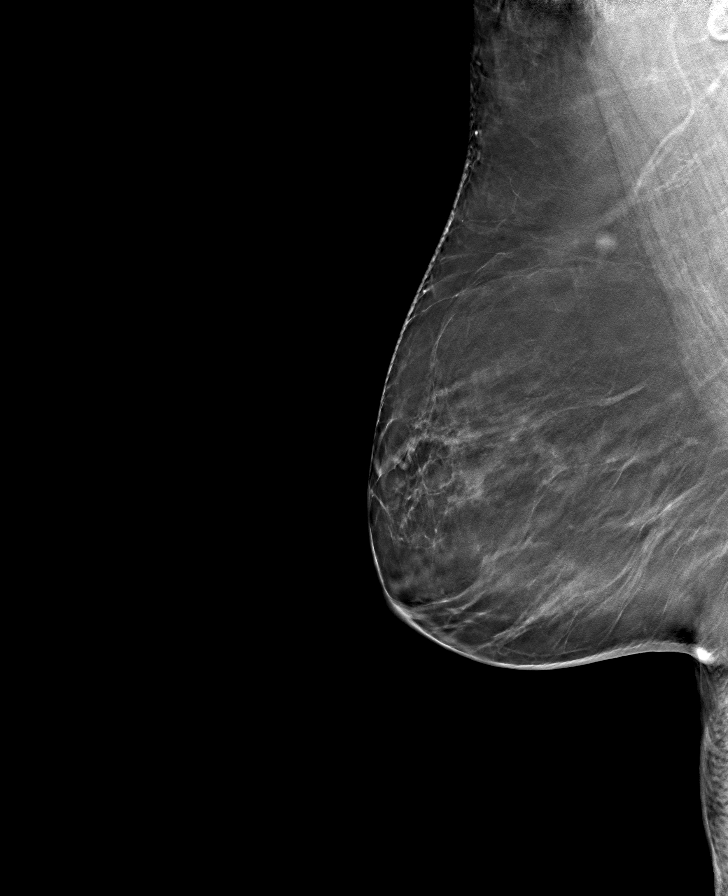

[L MLO tomo · tomo slice 44/87.0]
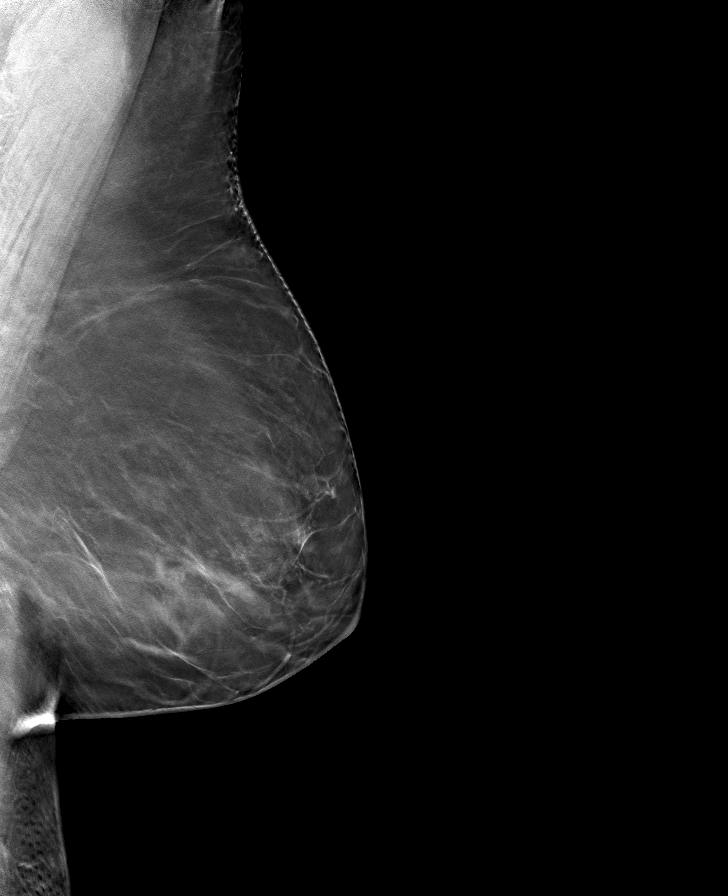

[8 of 24 positions shown; findings below may reference images not displayed]

FINDINGS: Stable suspected cyst and intramammary lymph nodes bilaterally. No new 
abnormality seen. Stable exam.
IMPRESSION: ( BI-RADS 2) Benign findings. Routine mammographic follow-up is recommended.

## 2022-04-05 IMAGING — MG MAMMOGRAPHY SCREENING BILATERAL 3[PERSON_NAME]
8 series · 8 of 24 positions shown · non-contrast
Comparison: Comparison was made to prior examinations.

________________________________________________________________________________________________ 
MAMMOGRAPHY SCREENING BILATERAL 3ONKX AZANIA, 04/05/2022 [DATE]: 
CLINICAL INDICATION: Screening.
TECHNIQUE: Digital bilateral mammograms and 3-D Tomosynthesis were obtained. 
These were interpreted both primarily and with the aid of computer-aided 
detection system.  
BREAST DENSITY: (Level B) There are scattered areas of fibroglandular density.

[L CC]
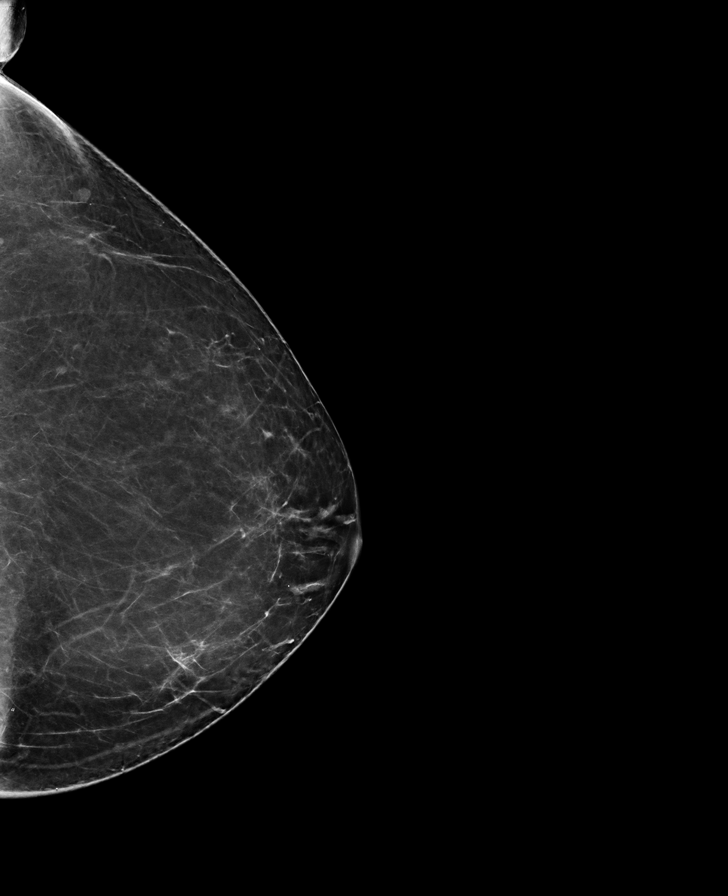

[L MLO]
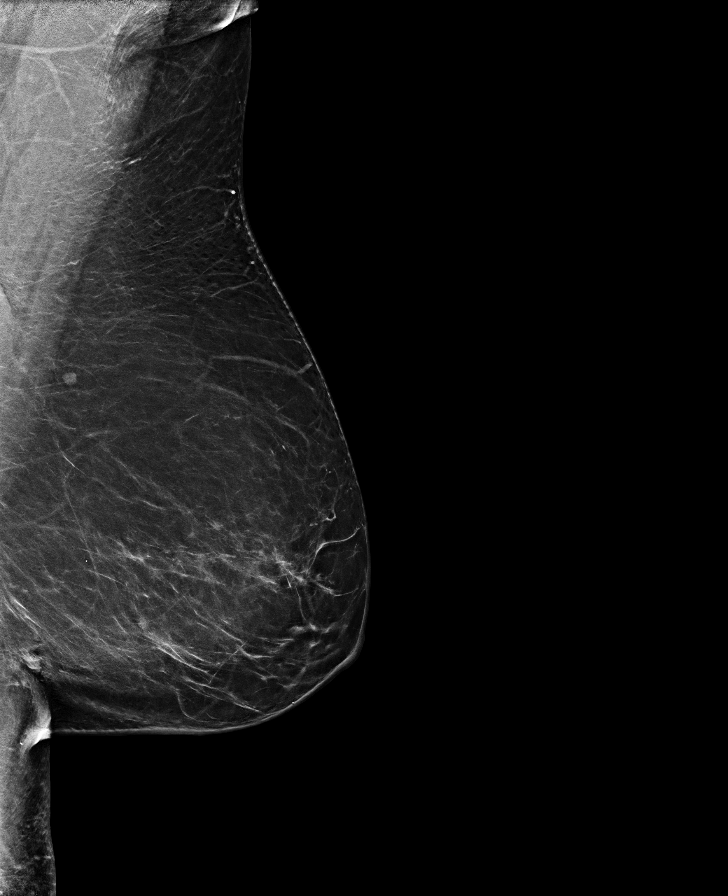

[R CC]
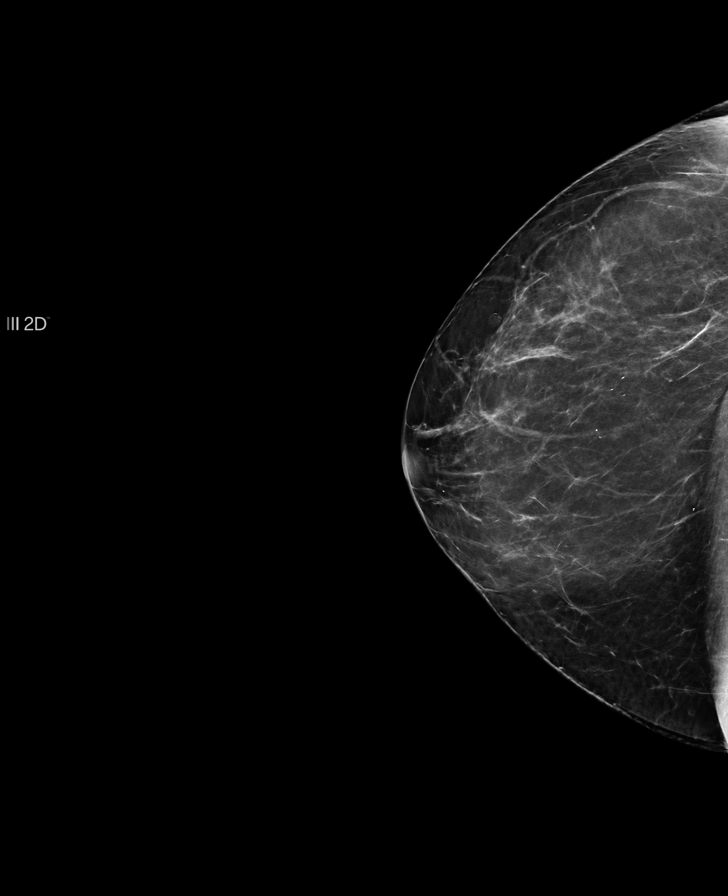

[R MLO]
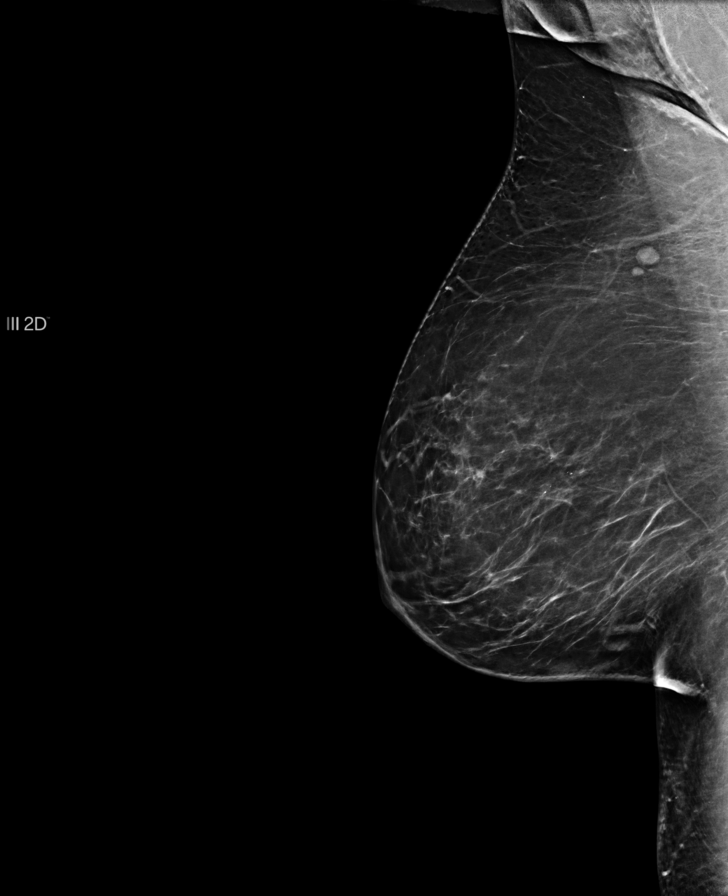

[L MLO tomo · tomo slice 39/78.0]
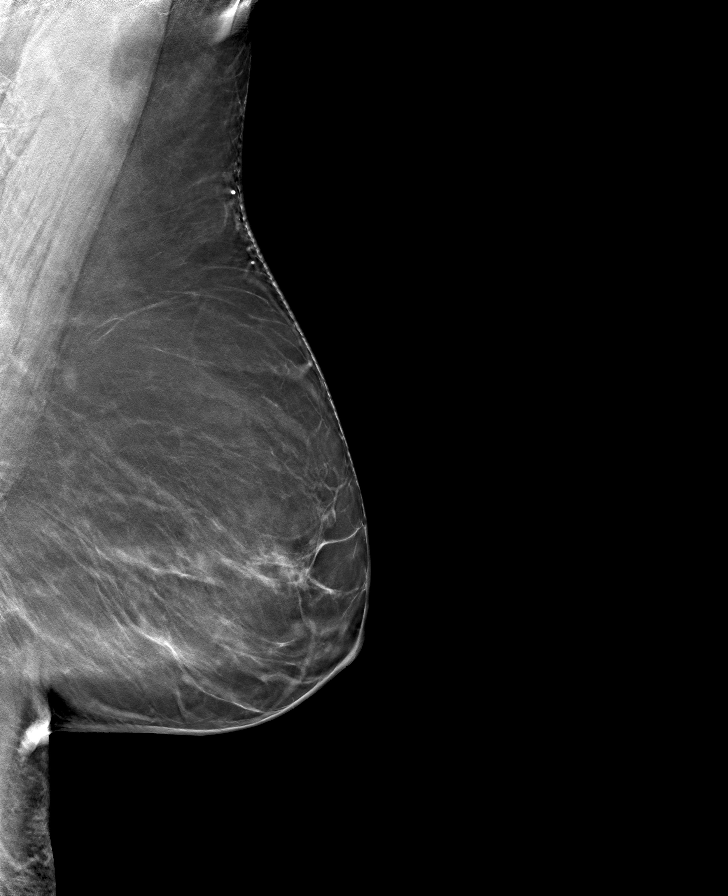

[L CC tomo · tomo slice 35/69.0]
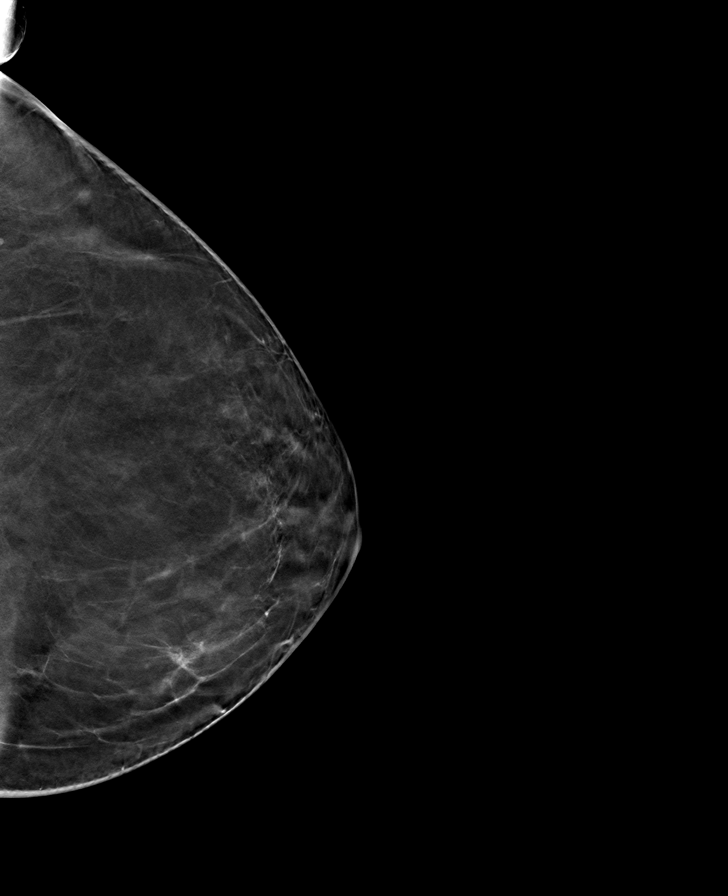

[R CC tomo · tomo slice 33/66.0]
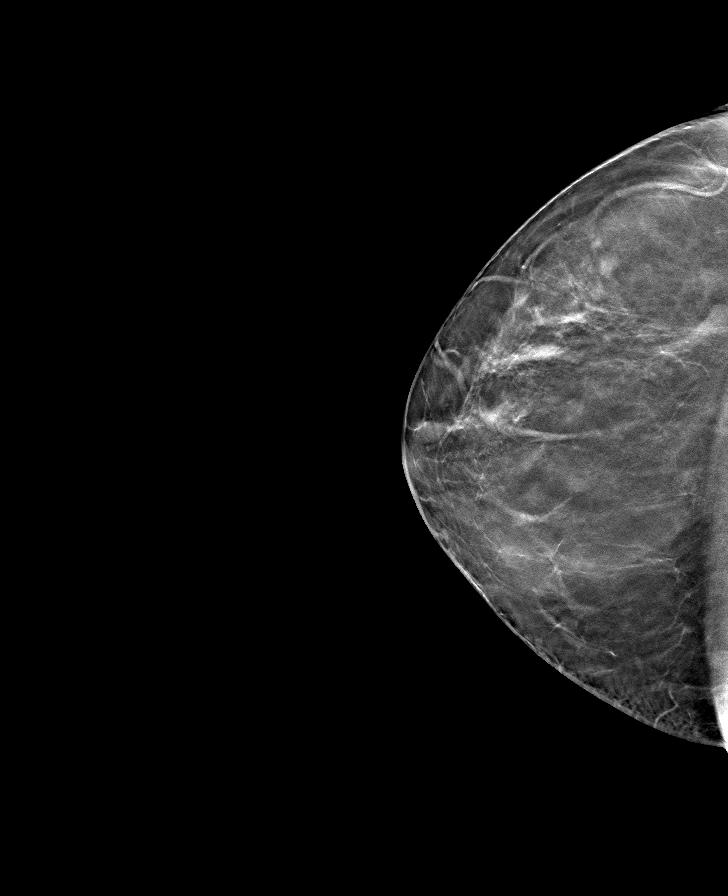

[R MLO tomo · tomo slice 39/77.0]
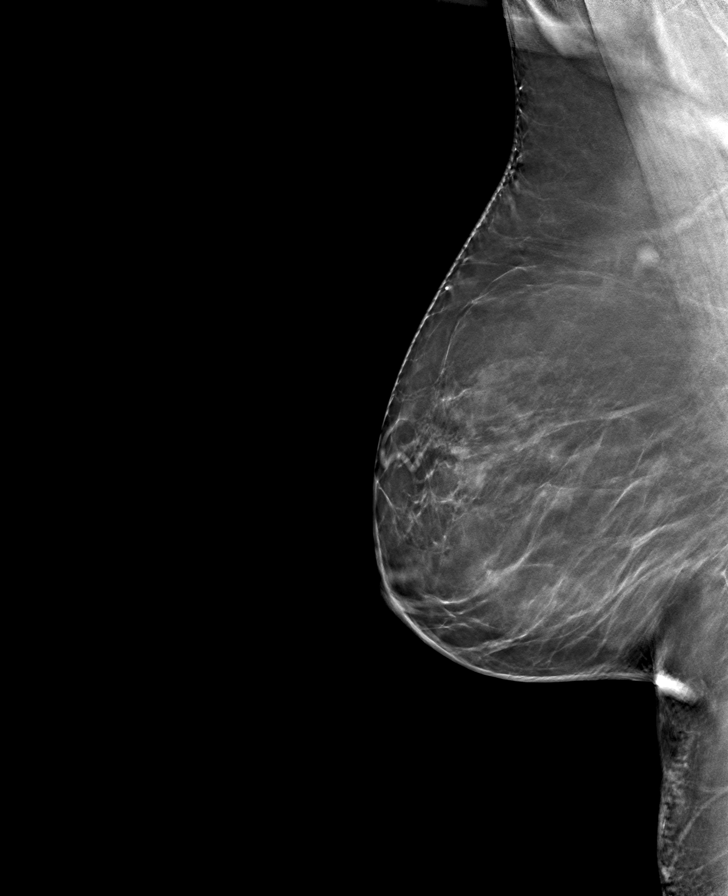

[8 of 24 positions shown; findings below may reference images not displayed]

FINDINGS: No suspicious mass, calcifications, or area of architectural 
distortion in either breast.
IMPRESSION: (BI-RADS 2) Benign findings. Routine mammographic follow-up is recommended.

## 2023-02-17 IMAGING — CT CT ABDOMEN AND PELVIS WITH CONTRAST
2 of 3 series · 16 of 46 positions shown, 18 images · IV contrast (isovue)
Comparison: None

________________________________________________________________________________________________ 
CT ABDOMEN AND PELVIS WITH CONTRAST, 02/17/2023 [DATE]: 
A search for DICOM formatted images was conducted for prior CT imaging studies 
completed at a non-affiliated media free facility.  
CLINICAL INDICATION: Unspecified abdominal pain
TECHNIQUE: The abdomen and pelvis was scanned from lung bases through the pubic 
rami with 75 mL of Isovue 300 MDV on a high-resolution CT scanner using dose 
reduction techniques.  Routine MPR reconstructions were performed. Count of 
known CT and Cardiac Nuclear Medicine studies performed in the previous 12 
months = 0.

[Series 4: axial · axial · 0.73mm/px · z∈[-427,-40]mm · 13 of 149 slices shown, 15 images]
[im 10/149  soft-tissue]
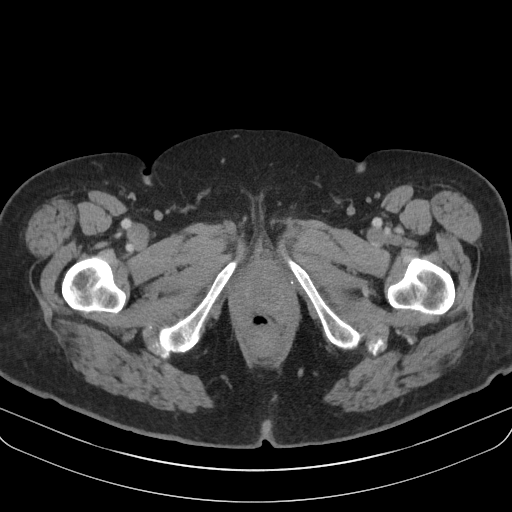
[im 10/149  bone]
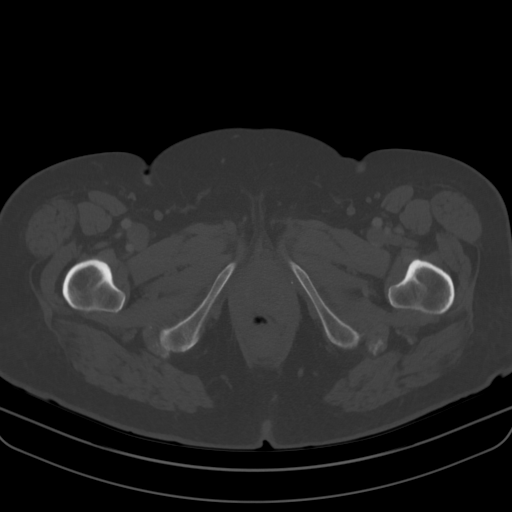
[im 20/149  soft-tissue]
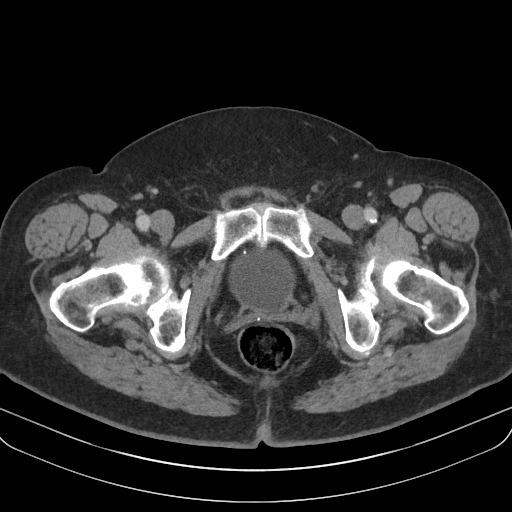
[im 29/149  soft-tissue]
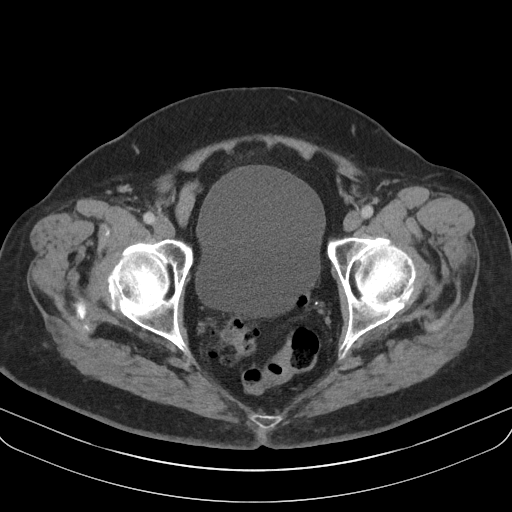
[im 43/149  soft-tissue]
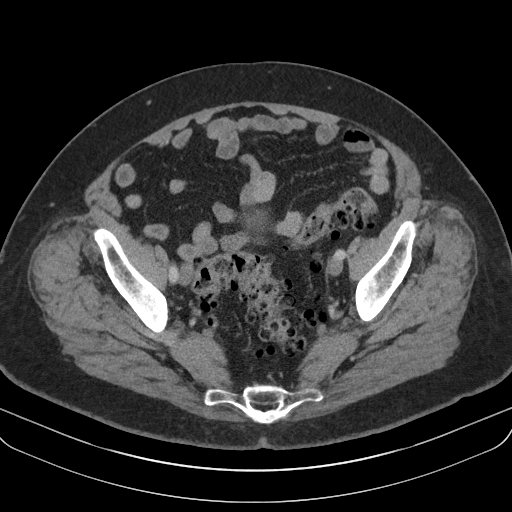
[im 53/149  soft-tissue]
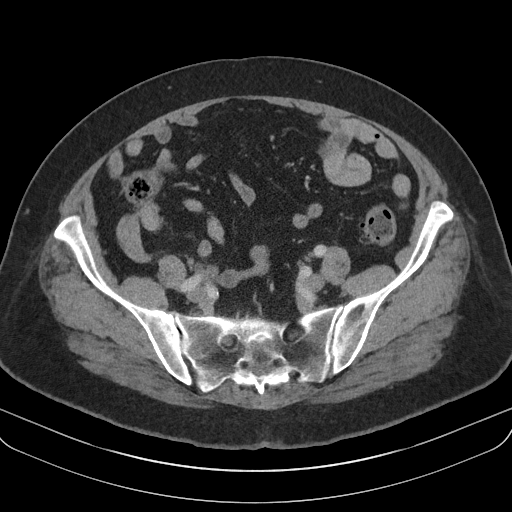
[im 63/149  soft-tissue]
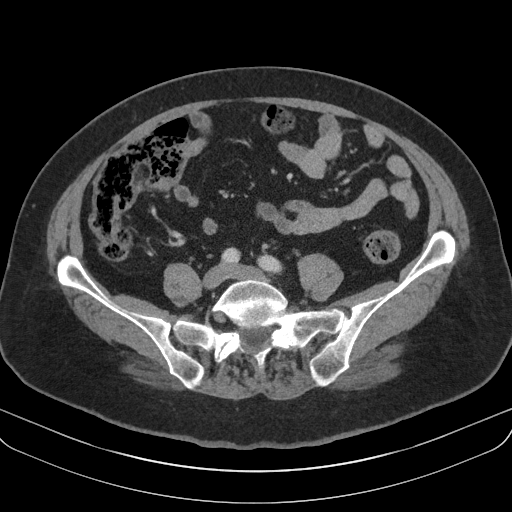
[im 77/149  soft-tissue]
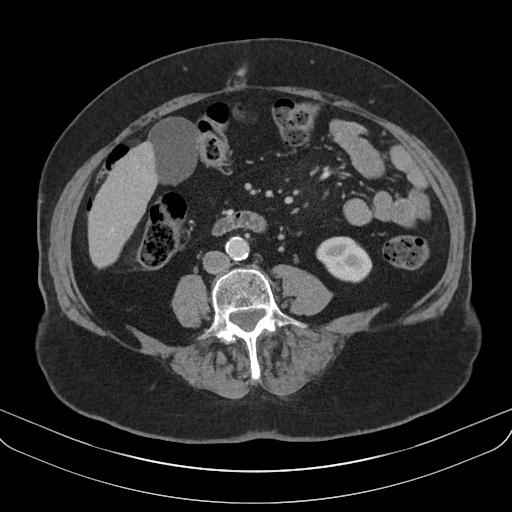
[im 86/149  soft-tissue]
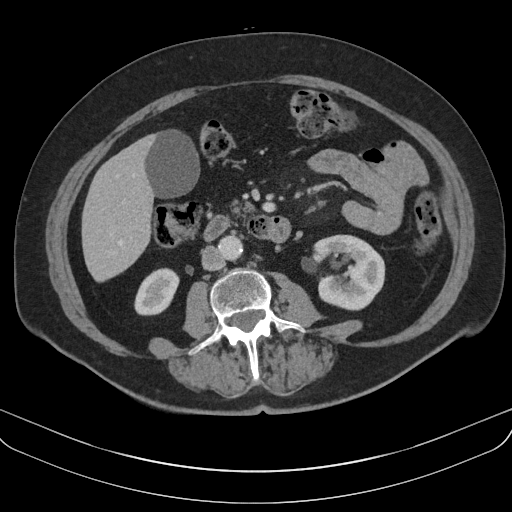
[im 96/149  soft-tissue]
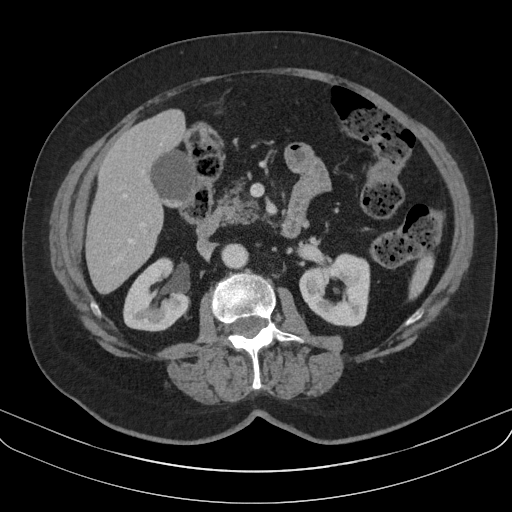
[im 96/149  bone]
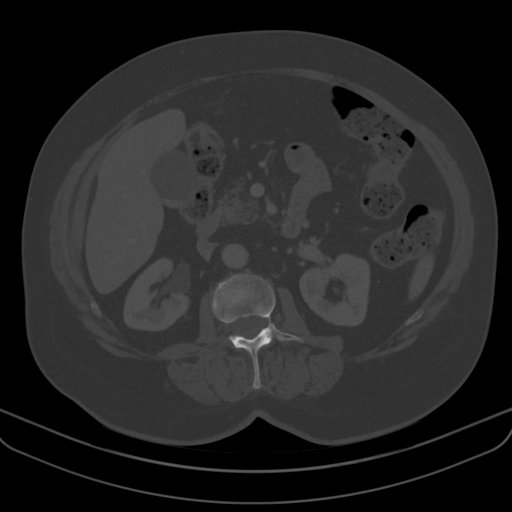
[im 106/149  soft-tissue]
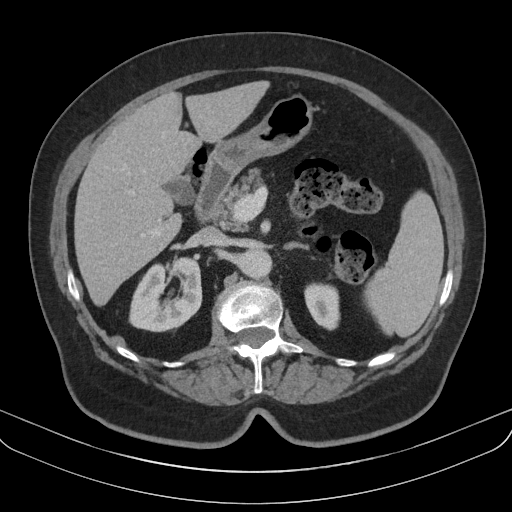
[im 120/149  soft-tissue]
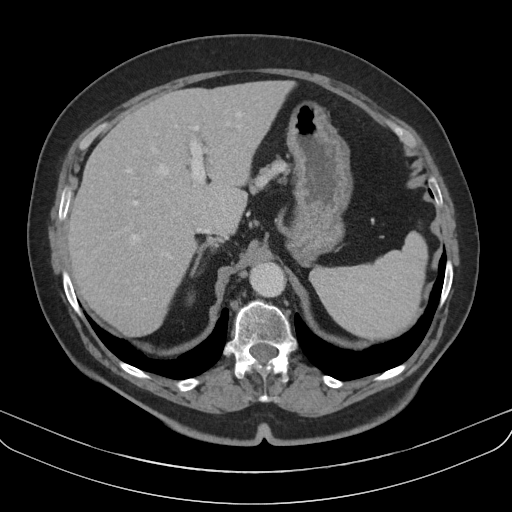
[im 129/149  soft-tissue]
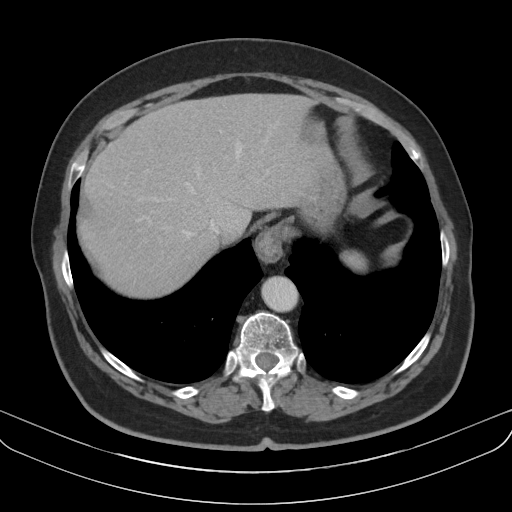
[im 139/149  soft-tissue]
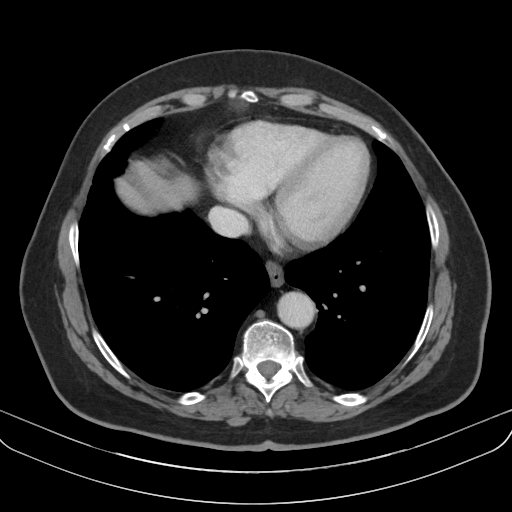

[Series 5: cor · coronal · 0.69mm/px · 3 of 151 slices shown]
[im 51/151  soft-tissue]
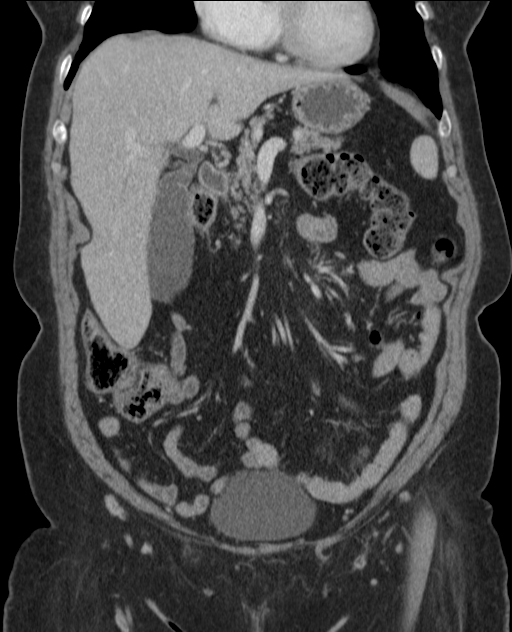
[im 67/151  soft-tissue]
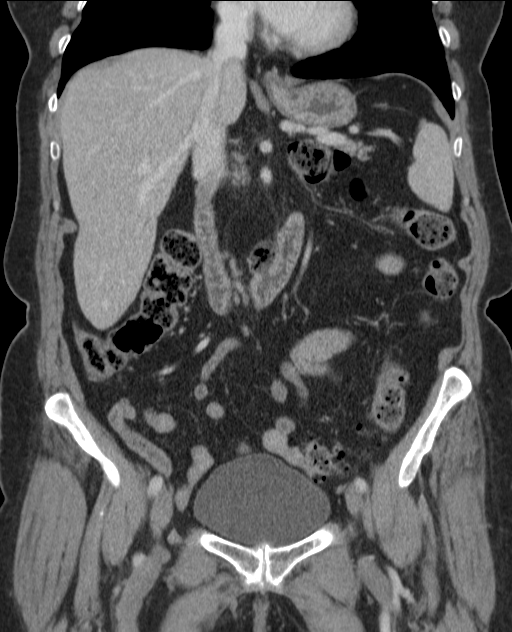
[im 84/151  soft-tissue]
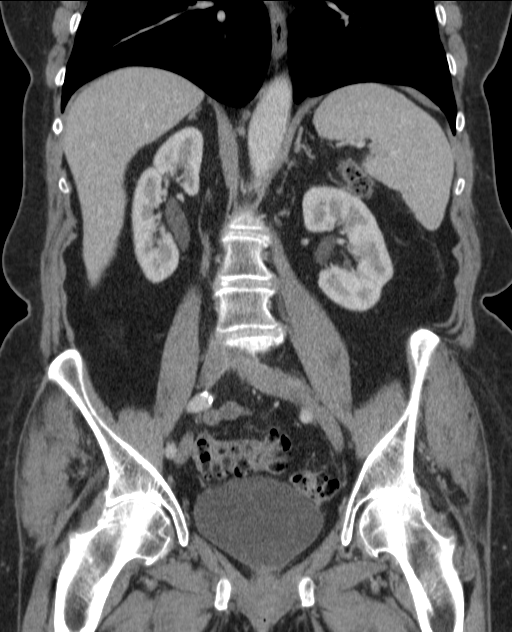

[16 of 46 positions shown; findings below may reference images not displayed]

FINDINGS: LUNG BASES: Lung bases are clear. No pleural effusions. 
HEPATOBILIARY: Hepatomegaly (21.7 cm). 0.5 cm right hepatic cyst. No mass or 
biliary dilatation. Cholelithiasis and mild gallbladder distention. Gallbladder 
is normal in thickness. No pericholecystic fat stranding. 
SPLEEN: Mild splenomegaly (13.1 cm). 
PANCREAS: No ductal dilatation or mass.   
ADRENALS: No mass. 
GENITOURINARY: No enhancing mass or hydronephrosis.  Bladder is distended. 
Hysterectomy. 
LYMPH NODES: No adenopathy. 
STOMACH, SMALL BOWEL AND COLON: Small hiatal hernia. Colonic diverticulosis 
without evidence of diverticulitis. No bowel wall thickening or obstruction. 
Normal appendix. 
VASCULAR STRUCTURES: No aneurysm. Atherosclerosis. 
MUSCULOSKELETAL: Tiny fat-containing umbilical hernia. No acute osseous 
abnormality. Scattered degenerative changes.  
ADDITIONAL FINDINGS: Minimal nonspecific central mesenteric fat stranding.
IMPRESSION: 1.  Hepatosplenomegaly and 0.5 cm right hepatic cyst.  
2.  Cholelithiasis and mild gallbladder distention. 
3.  Small hiatal hernia.  
4.  Colonic diverticulosis without evidence of diverticulitis.  
5.  Minimal nonspecific central mesenteric fat stranding. 
6.  Tiny fat-containing umbilical hernia.  
7.  Atherosclerosis. 
8.  Degenerative change. 
RADIATION DOSE REDUCTION: All CT scans are performed using radiation dose 
reduction techniques, when applicable.  Technical factors are evaluated and 
adjusted to ensure appropriate moderation of exposure.  Automated dose 
management technology is applied to adjust the radiation doses to minimize 
exposure while achieving diagnostic quality images.

## 2023-02-25 IMAGING — MR MRI ORBITS FACE OR NECK  W/WO CONTRAST
4 of 6 series · 27 of 48 positions shown · IV contrast (gadavist)
Comparison: None

________________________________________________________________________________________________ 
MRI BRAIN W/WO CONTRAST, MRI ORBITS FACE OR NECK  W/WO CONTRAST,02/25/2023 [DATE]: 
CLINICAL INDICATION: Ocular pain
TECHNIQUE: Multiplanar, multiecho position MR images of the brain were performed 
without and with 8.5 mL of Gadavist were injected intravenously. 1.5 mL of 
Gadavist was discarded.  Patient was scanned on a 1.5T (accession [id number]), 
1.5T (accession [id number]) magnet.

[Series 901: t1_cor_thin · coronal · 3.0mm · 0.37mm/px · 4 of 32 slices shown]
[im 1/32]
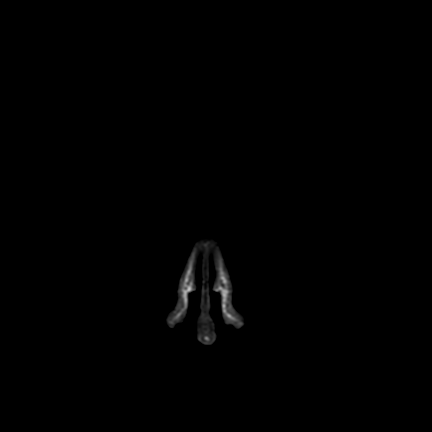
[im 4/32]
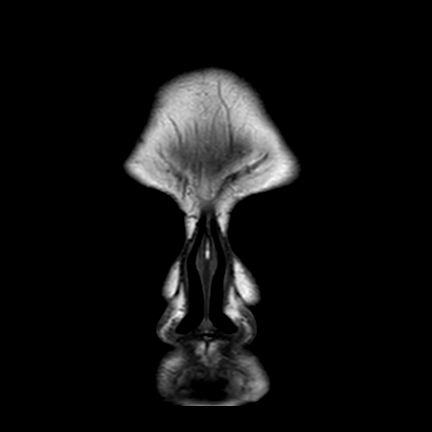
[im 16/32]
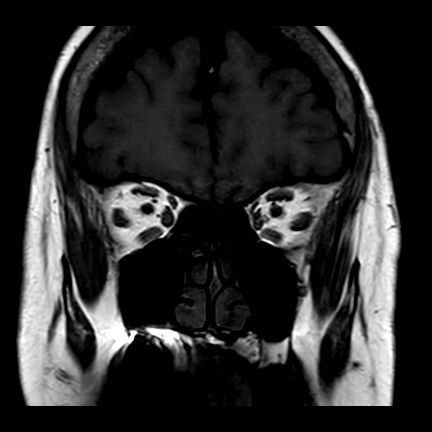
[im 28/32]
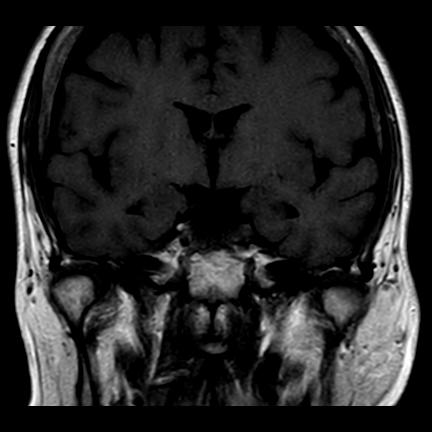

[Series 1001: t2w_spir_mvxd · coronal · 3.0mm · 0.48mm/px · 3 of 32 slices shown]
[im 4/32]
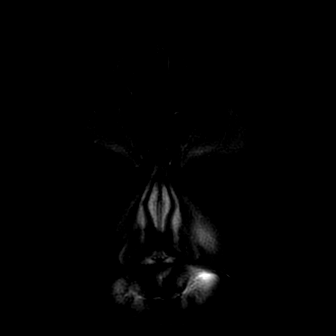
[im 16/32]
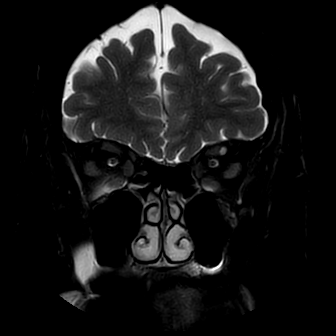
[im 28/32]
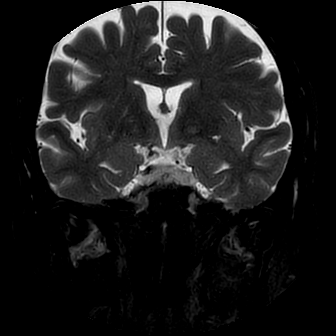

[Series 1301: T1 post-contrast · axial · 2.5mm · 0.36mm/px · z∈[-29,+17]mm · 11 of 40 slices shown (1 of 2)]
[im 1/40]
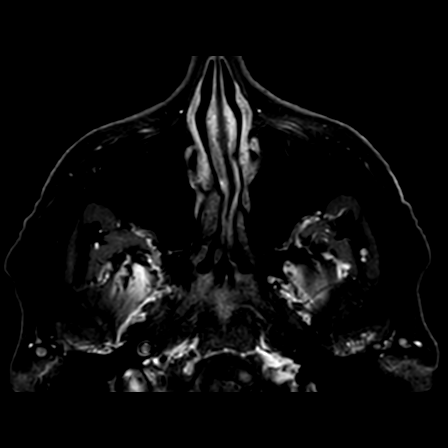
[im 4/40]
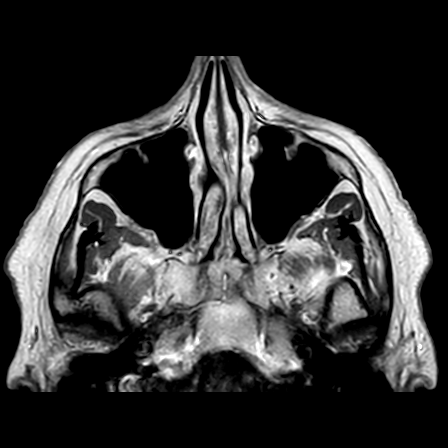
[im 8/40]
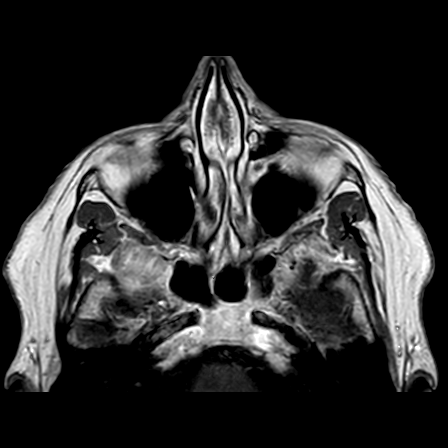
[im 12/40]
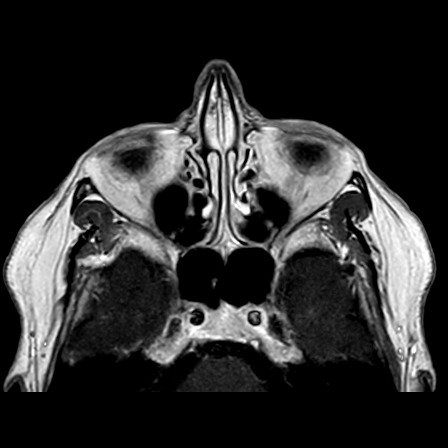
[im 16/40]
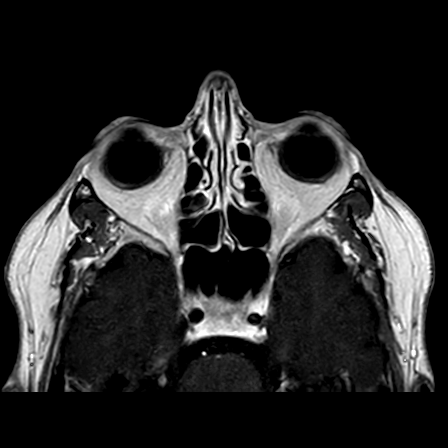
[im 20/40]
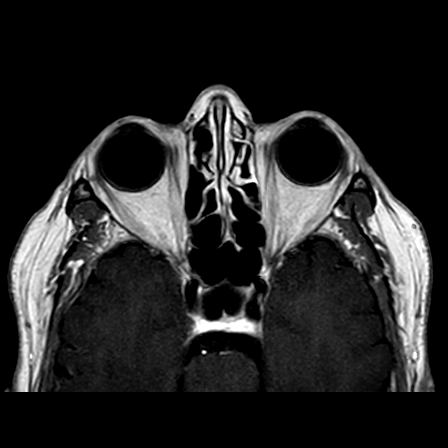
[im 24/40]
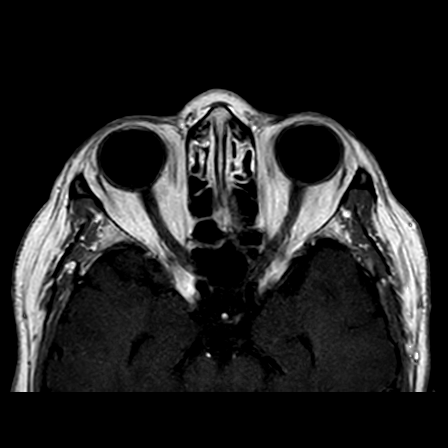
[im 28/40]
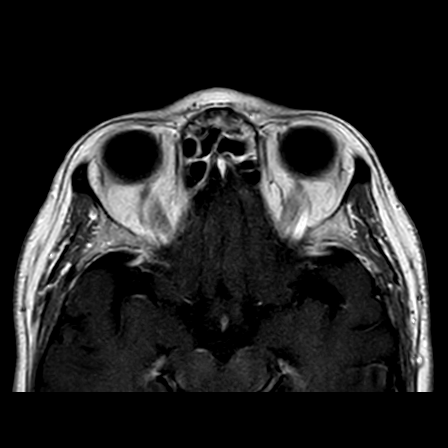
[im 32/40]
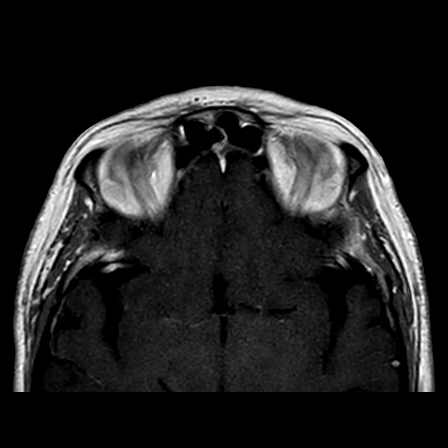
[im 36/40]
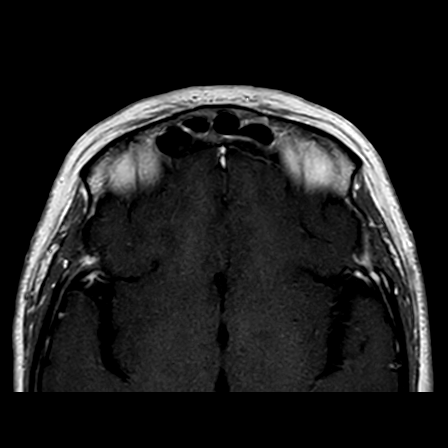
[im 40/40]
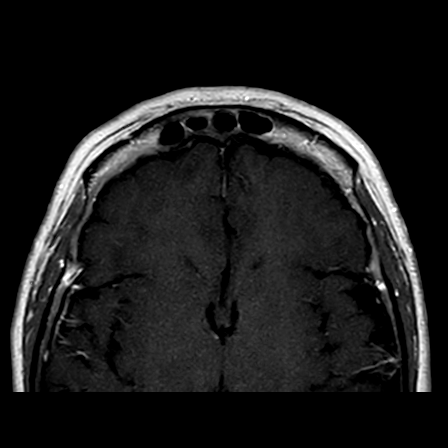

[Series 1401: T1 post-contrast · coronal · 3.0mm · 0.36mm/px · 9 of 32 slices shown (2 of 2)]
[im 1/32]
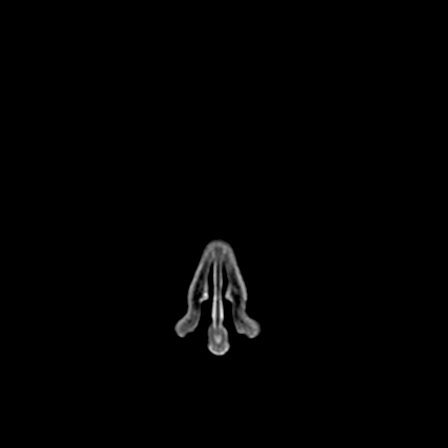
[im 4/32]
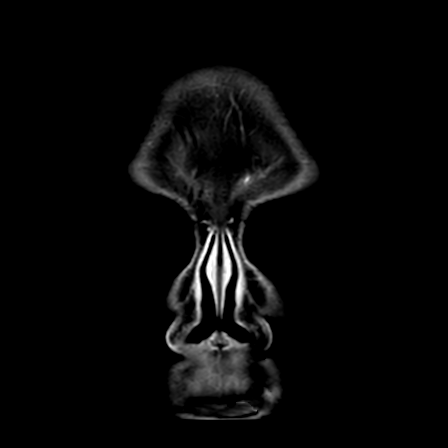
[im 8/32]
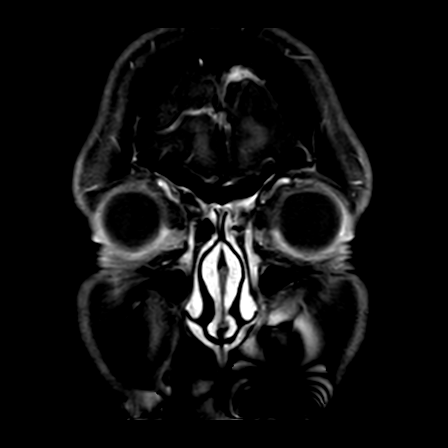
[im 12/32]
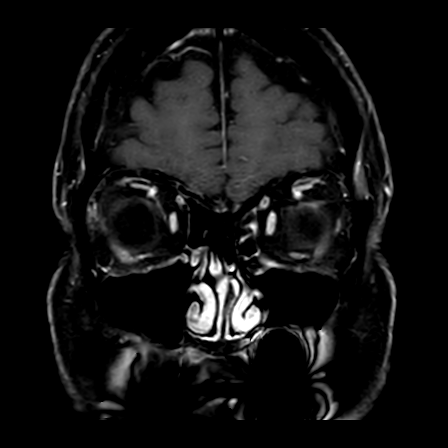
[im 16/32]
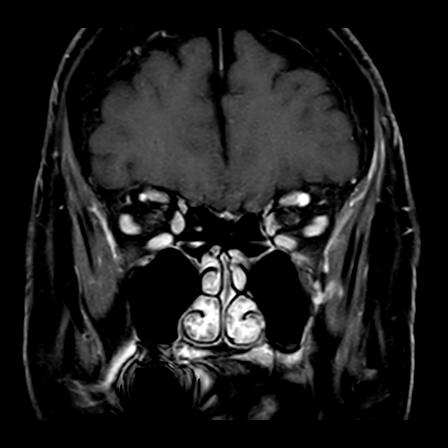
[im 20/32]
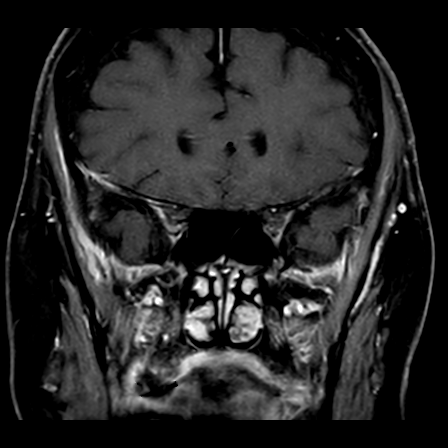
[im 24/32]
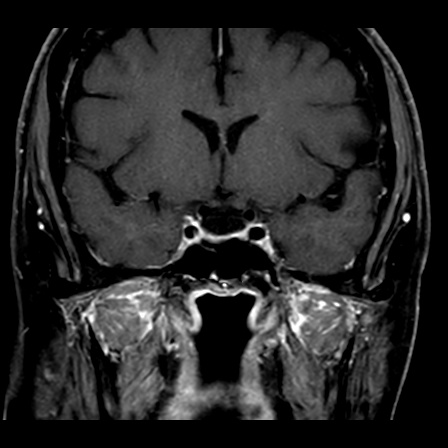
[im 28/32]
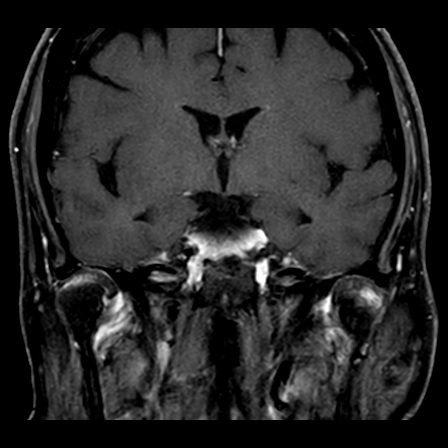
[im 32/32]
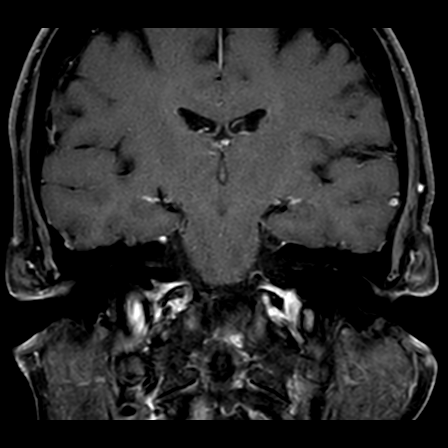

[27 of 48 positions shown; findings below may reference images not displayed]

FINDINGS: There is no orbital mass. There is bilateral ocular lens implant. No 
ocular hemorrhage or mass. Optic nerves show no abnormal signal. There appears 
to be mild atrophy of the left optic nerve, diameter approximately 2 mm, 3 mm on 
the right. No optic nerve sheath dilatation. The extraocular muscles are 
symmetric. Lacrimal glands and nasal lacrimal ducts are unremarkable. 
There is no intracranial mass or pathologic enhancement. No hydrocephalus. 
Diffusion-weighted images are negative. Susceptibility images are negative. 
There are no significant cerebral white matter signal changes. No discrete 
brainstem or cerebellar lesion. Major arterial segments and dural sinuses are 
open. No evidence for aneurysm or vascular malformation. Sellar contents 
unremarkable for age. No chiasmatic deformity. The craniocervical junction is 
open. There is no significant atrophy for age. 
Minimal ethmoid mucosal thickening. Other paranasal sinuses, tympanic cavities 
and mastoid air cells are clear.
IMPRESSION: Mild left optic nerve atrophy. No orbital mass. 
Bilateral ocular lens implants appear appropriately positioned. No evidence for 
ocular hemorrhage or mass. 
No intracranial mass. No evidence for recent infarct, hydrocephalus or 
hemorrhage. 
There is no significant atrophy or white matter microangiopathy.

## 2023-02-25 IMAGING — MR MRI BRAIN W/WO CONTRAST
8 of 12 series · 28 of 48 positions shown · IV contrast (gadavist)
Comparison: None

________________________________________________________________________________________________ 
MRI BRAIN W/WO CONTRAST, MRI ORBITS FACE OR NECK  W/WO CONTRAST,02/25/2023 [DATE]: 
CLINICAL INDICATION: Ocular pain
TECHNIQUE: Multiplanar, multiecho position MR images of the brain were performed 
without and with 8.5 mL of Gadavist were injected intravenously. 1.5 mL of 
Gadavist was discarded.  Patient was scanned on a 1.5T (accession [id number]), 
1.5T (accession [id number]) magnet.

[Series 102: mpr - smartbrain · axial · 1.1mm · 1.09mm/px · 1 of 2 slices shown]
[im 1/2]
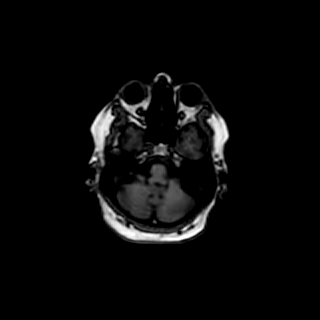

[Series 203: dadc map · axial · 5.0mm · 1.03mm/px · z∈[-66,+98]mm · 2 of 29 slices shown (1 of 2)]
[im 1/29]
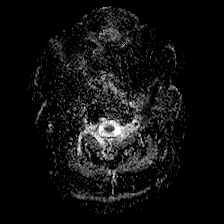
[im 29/29]
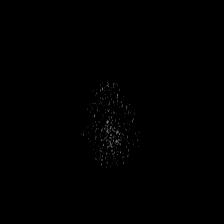

[Series 204: (id) · axial · 5.0mm · 1.03mm/px · z∈[-66,+98]mm · 3 of 29 slices shown (1 of 2)]
[im 1/29]
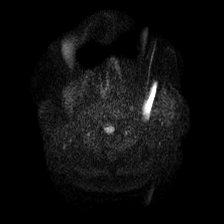
[im 15/29]
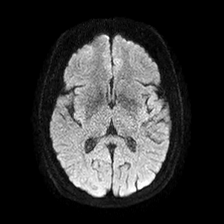
[im 29/29]
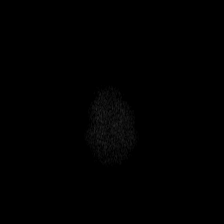

[Series 303: dadc map · coronal · 5.0mm · 1.03mm/px · 4 of 33 slices shown (2 of 2)]
[im 1/33]
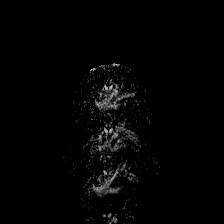
[im 11/33]
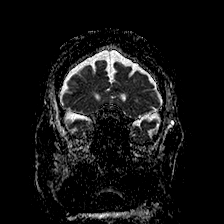
[im 22/33]
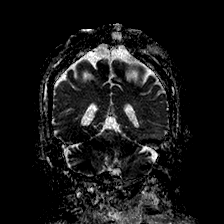
[im 33/33]
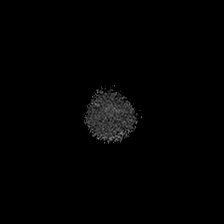

[Series 304: (id) · coronal · 5.0mm · 1.03mm/px · 4 of 33 slices shown (2 of 2)]
[im 1/33]
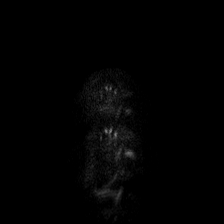
[im 11/33]
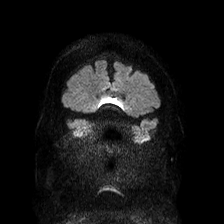
[im 22/33]
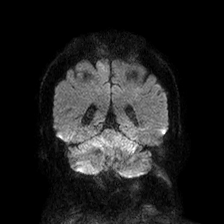
[im 33/33]
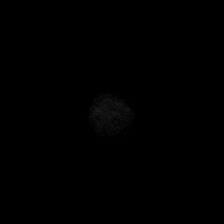

[Series 401: t1_se_sag · sagittal · 4.0mm · 0.43mm/px · 3 of 31 slices shown]
[im 1/31]
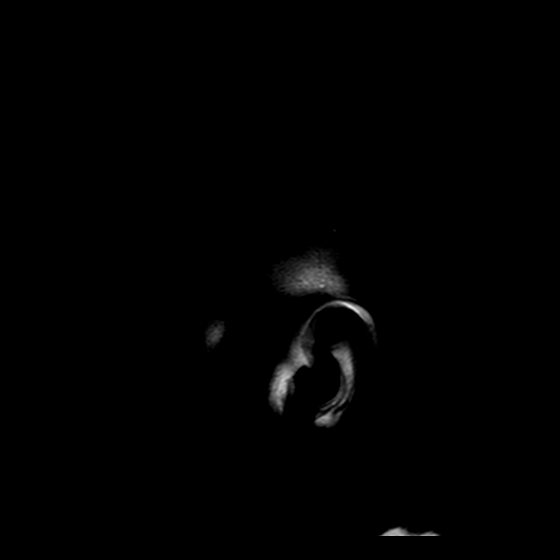
[im 16/31]
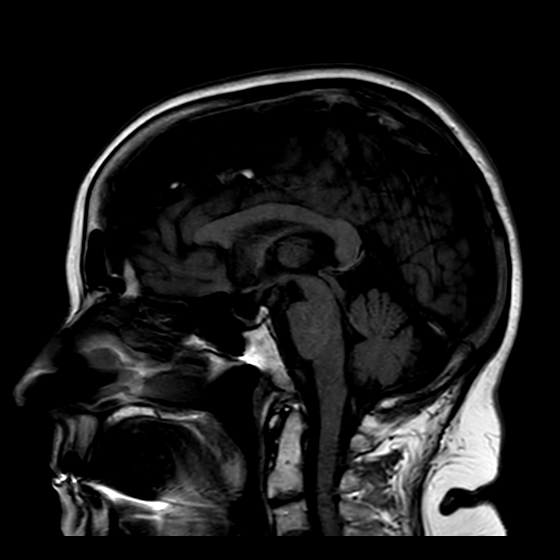
[im 31/31]
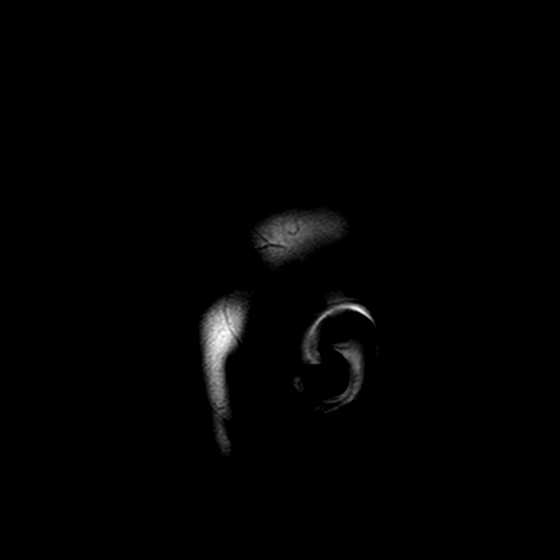

[Series 501: flair_ax+fs · axial · 5.0mm · 0.49mm/px · z∈[-65,+100]mm · 3 of 29 slices shown]
[im 1/29]
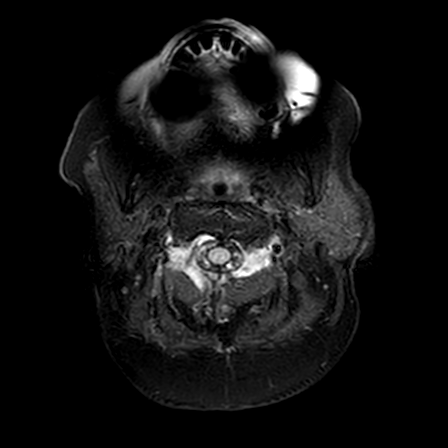
[im 15/29]
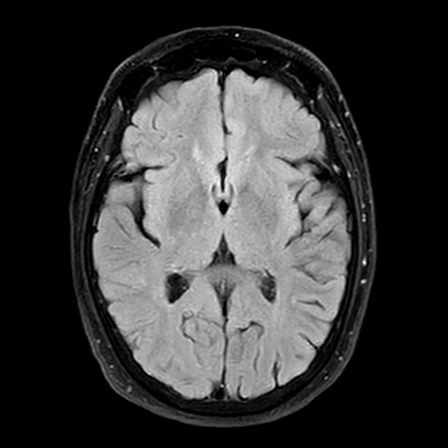
[im 29/29]
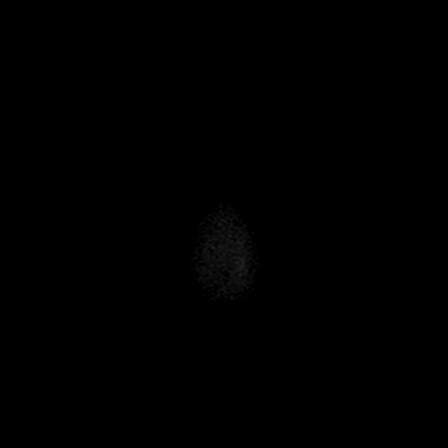

[Series 602: swip · axial · 10.0mm · 0.36mm/px · z∈[-45,+91]mm · 8 of 140 slices shown]
[im 1/140]
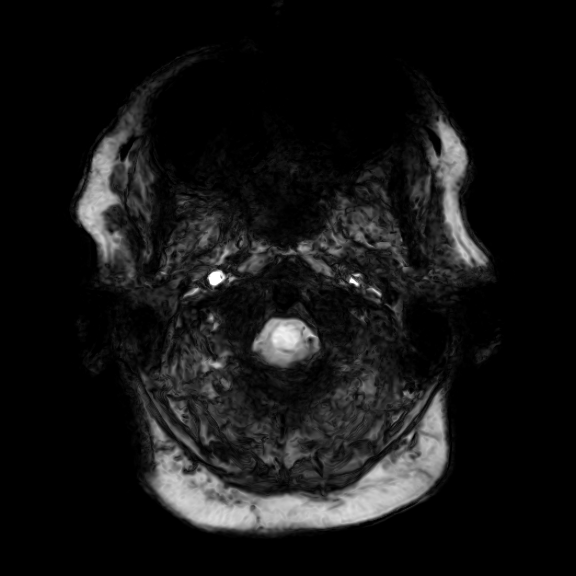
[im 20/140]
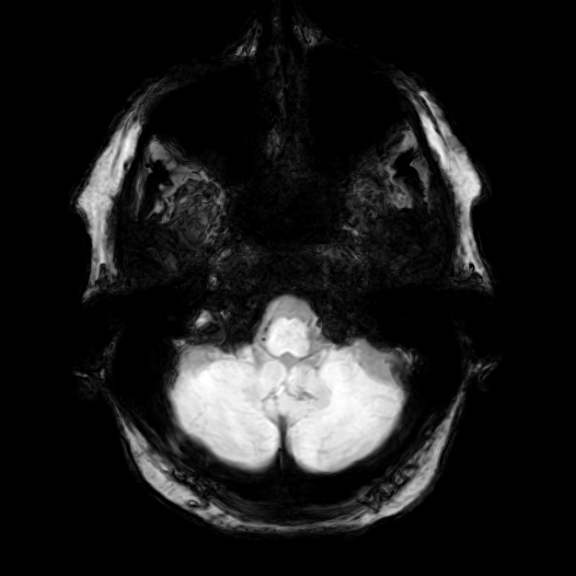
[im 40/140]
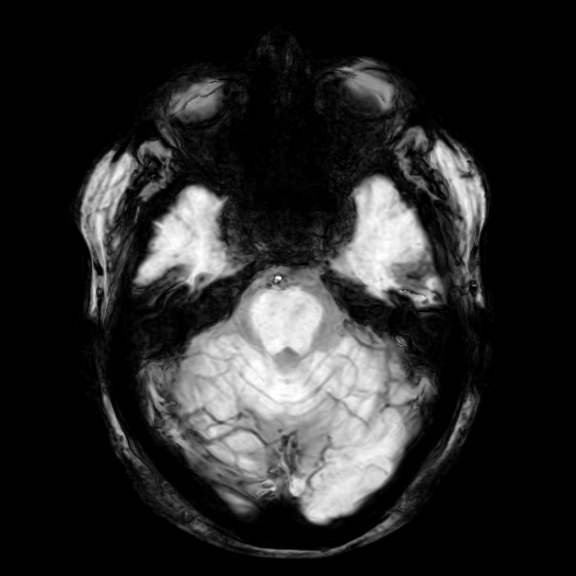
[im 60/140]
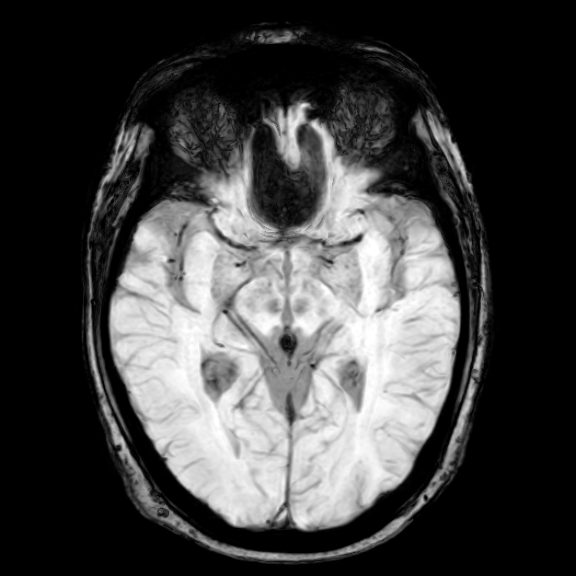
[im 80/140]
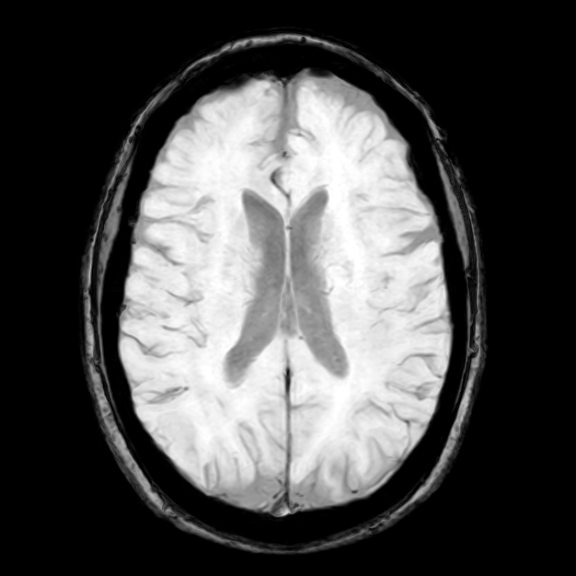
[im 100/140]
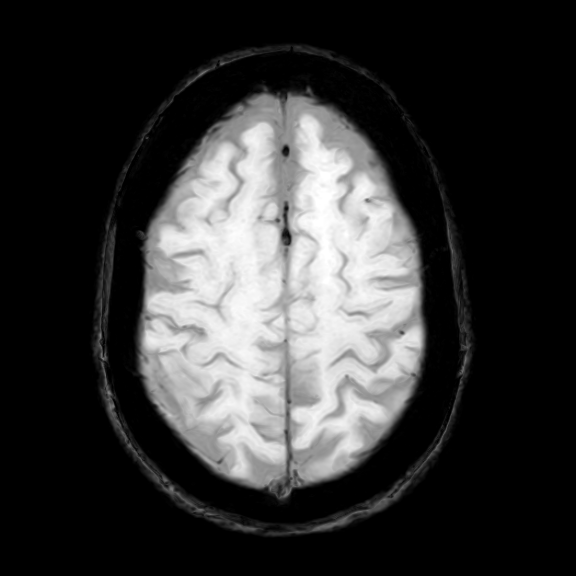
[im 120/140]
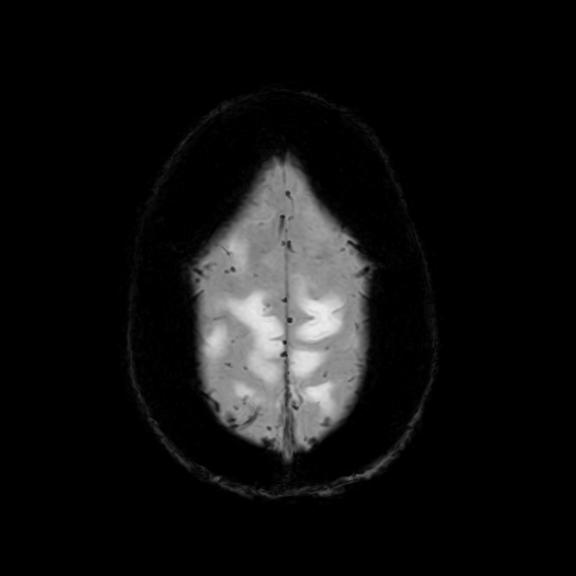
[im 140/140]
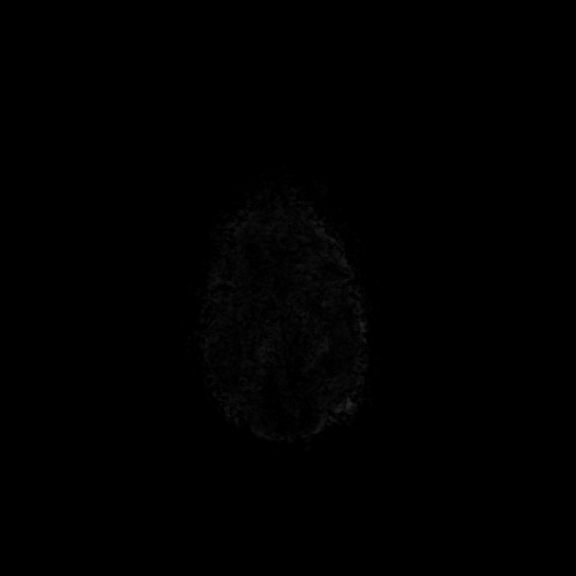

[28 of 48 positions shown; findings below may reference images not displayed]

FINDINGS: There is no orbital mass. There is bilateral ocular lens implant. No 
ocular hemorrhage or mass. Optic nerves show no abnormal signal. There appears 
to be mild atrophy of the left optic nerve, diameter approximately 2 mm, 3 mm on 
the right. No optic nerve sheath dilatation. The extraocular muscles are 
symmetric. Lacrimal glands and nasal lacrimal ducts are unremarkable. 
There is no intracranial mass or pathologic enhancement. No hydrocephalus. 
Diffusion-weighted images are negative. Susceptibility images are negative. 
There are no significant cerebral white matter signal changes. No discrete 
brainstem or cerebellar lesion. Major arterial segments and dural sinuses are 
open. No evidence for aneurysm or vascular malformation. Sellar contents 
unremarkable for age. No chiasmatic deformity. The craniocervical junction is 
open. There is no significant atrophy for age. 
Minimal ethmoid mucosal thickening. Other paranasal sinuses, tympanic cavities 
and mastoid air cells are clear.
IMPRESSION: Mild left optic nerve atrophy. No orbital mass. 
Bilateral ocular lens implants appear appropriately positioned. No evidence for 
ocular hemorrhage or mass. 
No intracranial mass. No evidence for recent infarct, hydrocephalus or 
hemorrhage. 
There is no significant atrophy or white matter microangiopathy.

## 2023-06-08 IMAGING — MG MAMMOGRAPHY SCREENING BILATERAL 3[PERSON_NAME]
8 series · 9 of 24 positions shown · non-contrast
Comparison: Comparison was made to prior examinations.

________________________________________________________________________________________________ 
MAMMOGRAPHY SCREENING BILATERAL 3SILVESTER MOSKOWITZ, 06/08/2023 [DATE]: 
CLINICAL INDICATION: Encounter for screening mammogram.
TECHNIQUE: Digital bilateral mammograms and 3-D Tomosynthesis were obtained. 
These were interpreted both primarily and with the aid of computer-aided 
detection system.  
BREAST DENSITY: (Level B) There are scattered areas of fibroglandular density.

[R MLO]
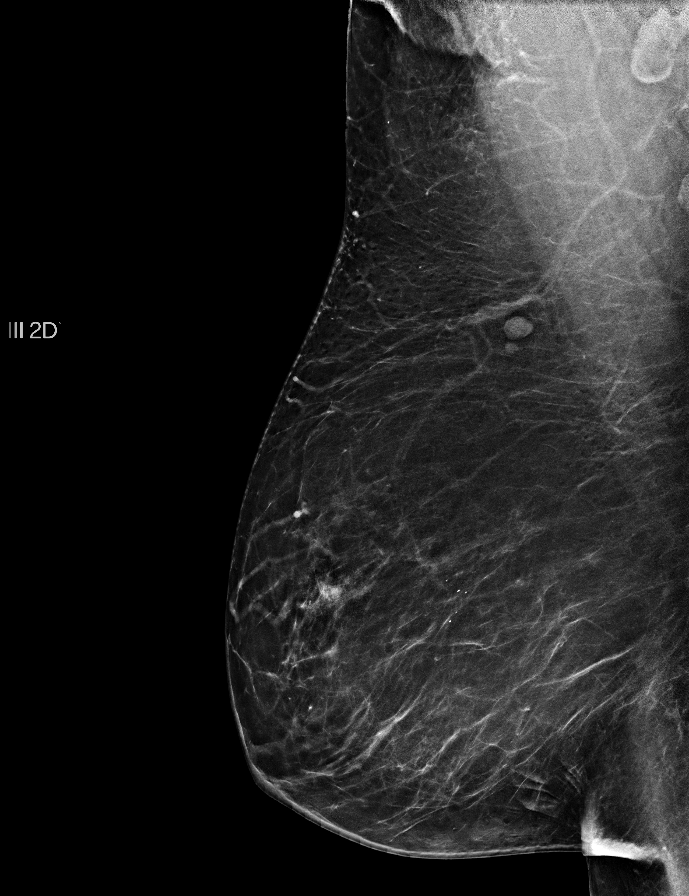

[L CC]
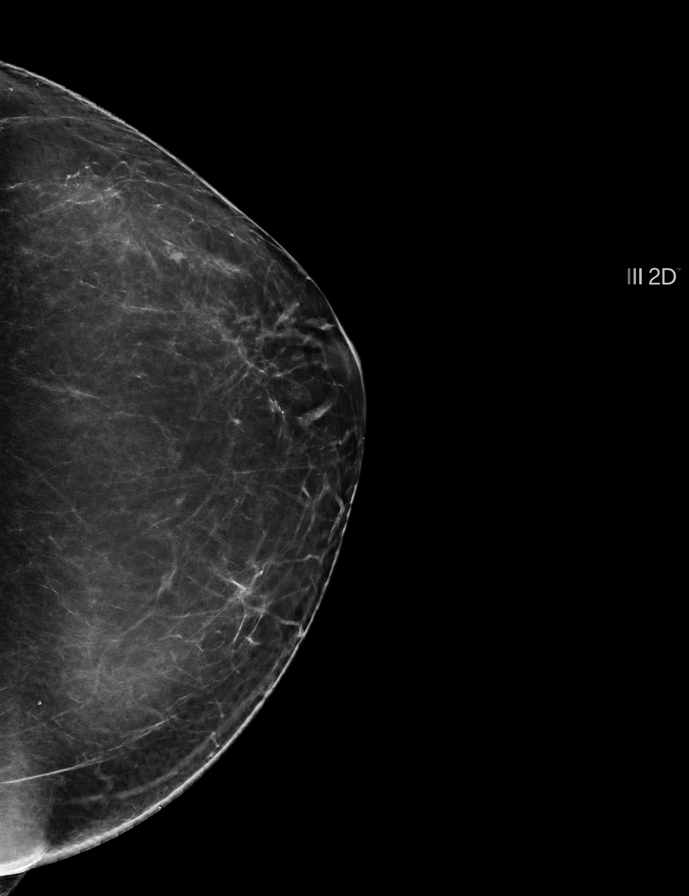

[L MLO]
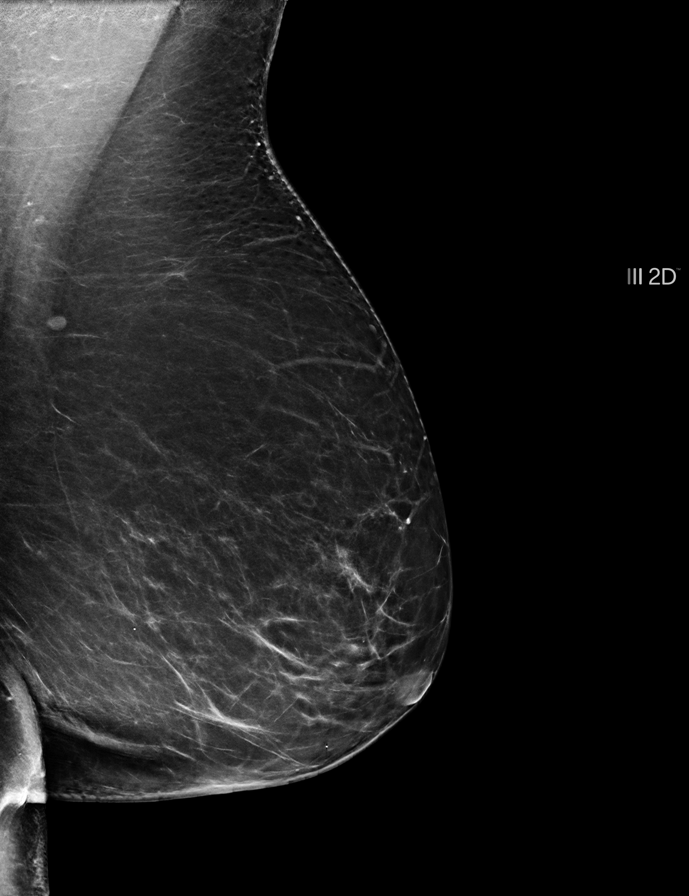

[R CC]
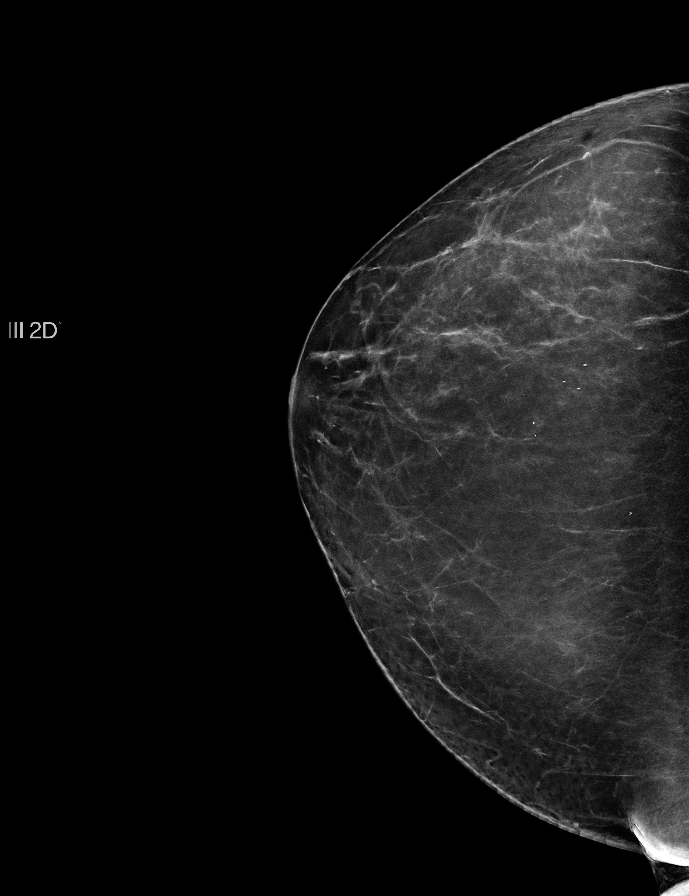

[R CC tomo · 2 of 64 frames shown]
[frame 21/64]
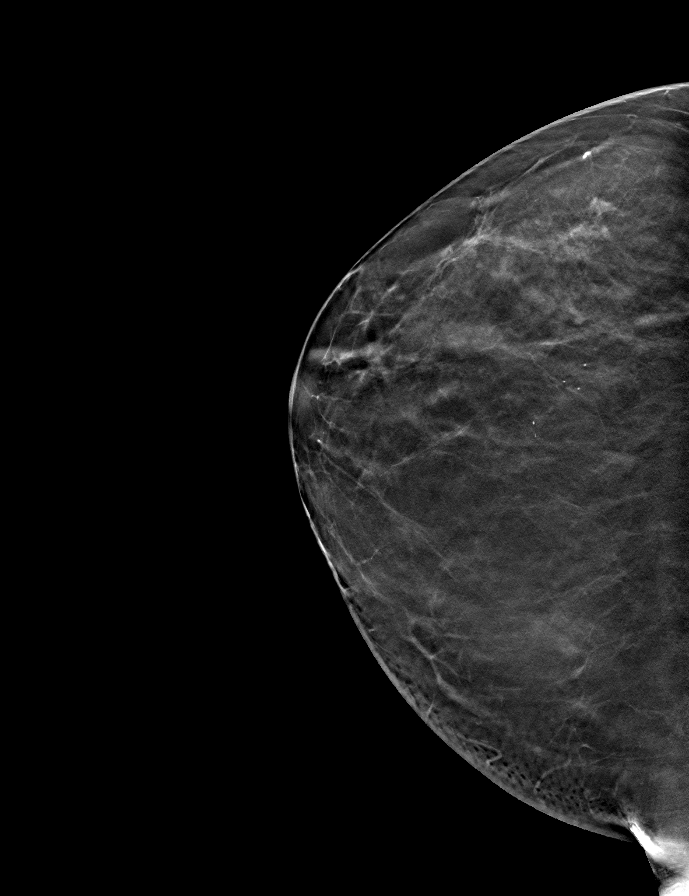
[frame 33/64]
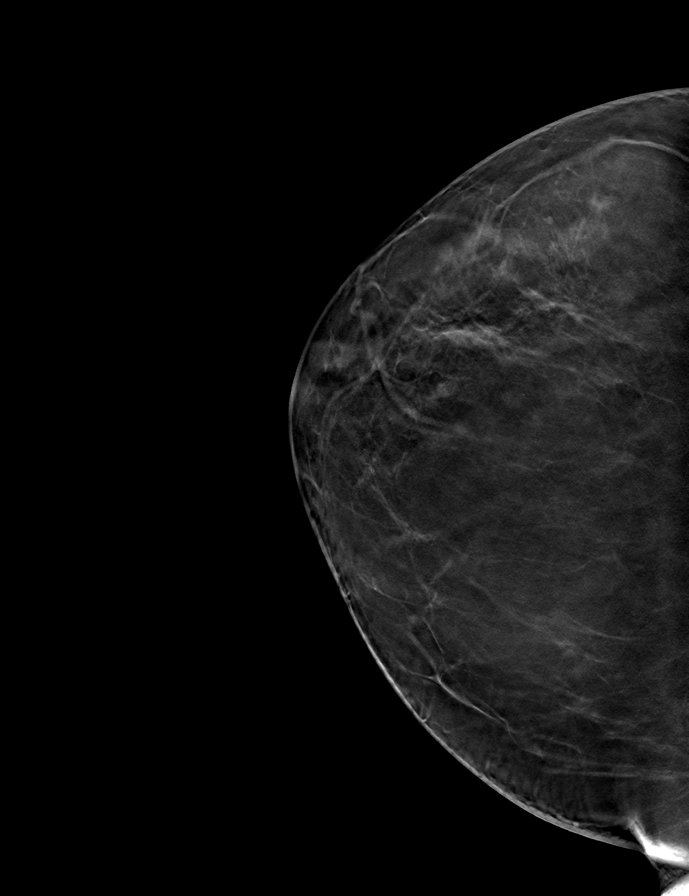

[L MLO tomo · tomo slice 39/76.0]
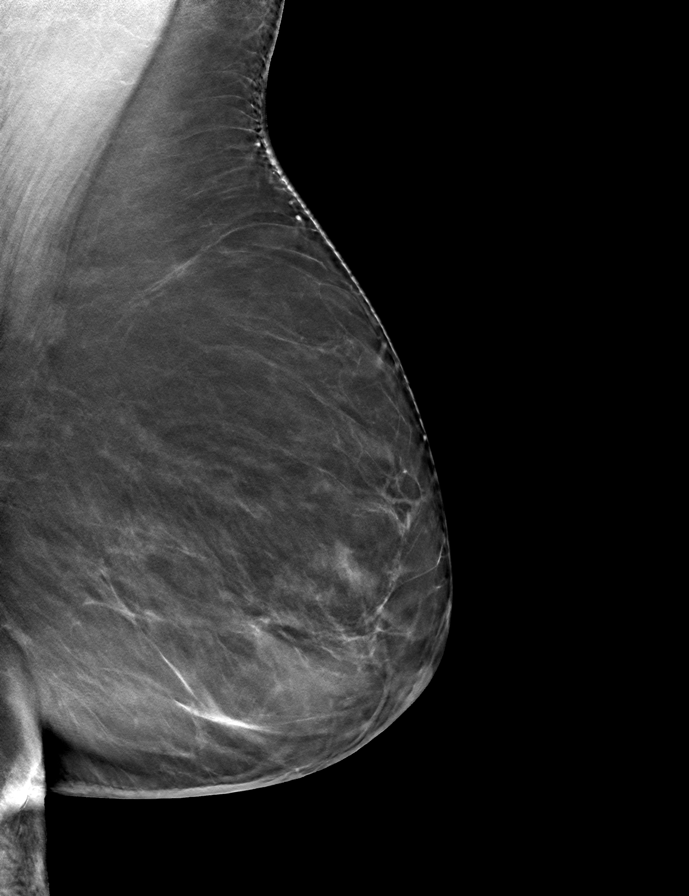

[L CC tomo · tomo slice 33/64.0]
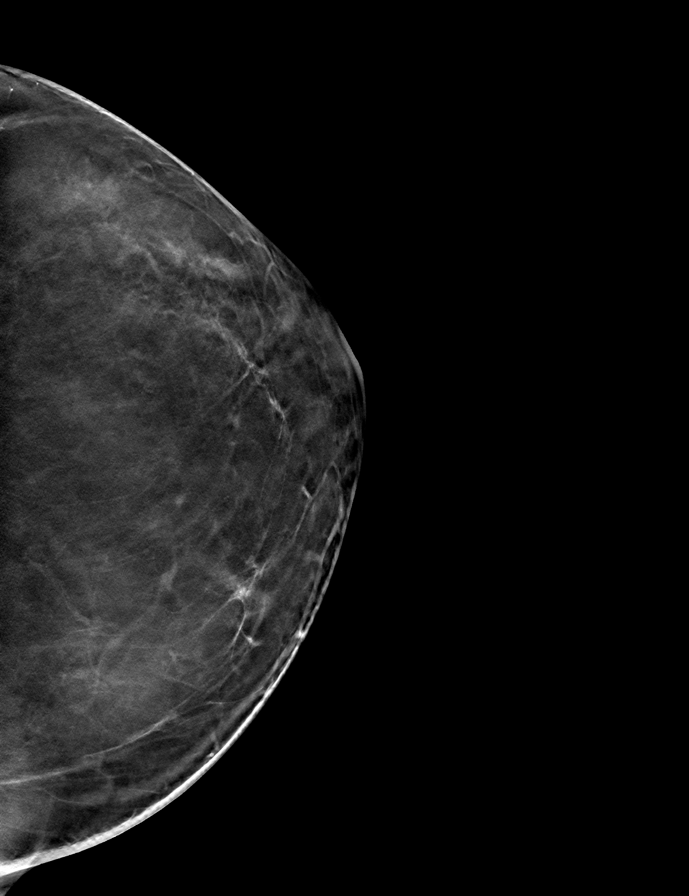

[R MLO tomo · tomo slice 41/80.0]
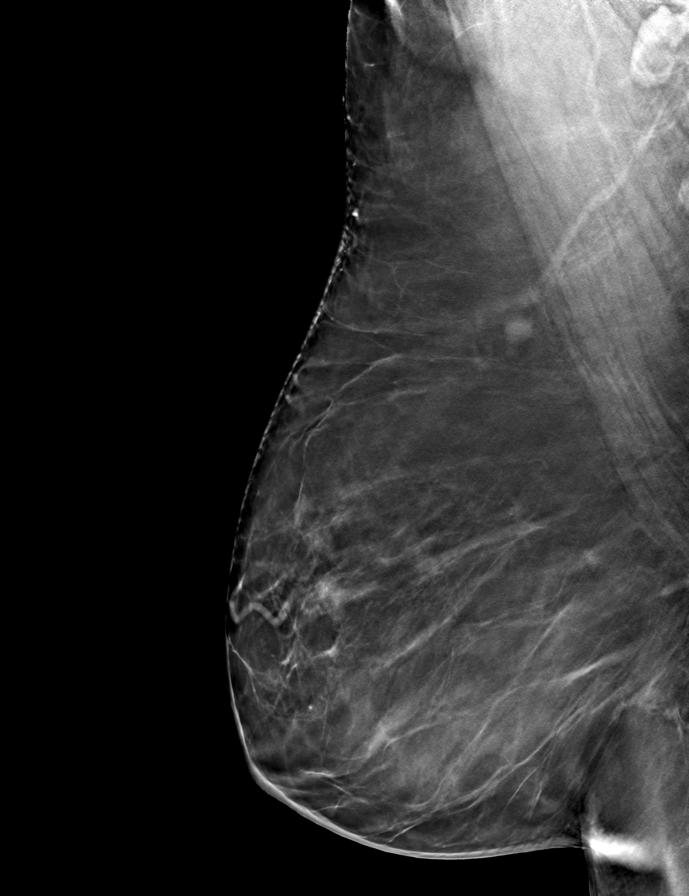

[9 of 24 positions shown; findings below may reference images not displayed]

FINDINGS: No suspicious mass, calcifications, or area of architectural 
distortion in either breast.
IMPRESSION: (BI-RADS 2) Benign findings. Routine mammographic follow-up is recommended.
